# Patient Record
Sex: Female | Born: 1990 | Race: Black or African American | Hispanic: No | Marital: Single | Smoking: Current every day smoker
Health system: Southern US, Community
[De-identification: ages and names within clinical notes are randomized; demographics above are authoritative.]

## PROBLEM LIST (undated history)

## (undated) ENCOUNTER — Inpatient Hospital Stay (HOSPITAL_COMMUNITY): Payer: Self-pay

## (undated) DIAGNOSIS — J45909 Unspecified asthma, uncomplicated: Secondary | ICD-10-CM

## (undated) DIAGNOSIS — Z8759 Personal history of other complications of pregnancy, childbirth and the puerperium: Secondary | ICD-10-CM

## (undated) DIAGNOSIS — O139 Gestational [pregnancy-induced] hypertension without significant proteinuria, unspecified trimester: Secondary | ICD-10-CM

## (undated) DIAGNOSIS — Z8619 Personal history of other infectious and parasitic diseases: Secondary | ICD-10-CM

## (undated) DIAGNOSIS — A599 Trichomoniasis, unspecified: Secondary | ICD-10-CM

## (undated) HISTORY — PX: DILATION AND CURETTAGE OF UTERUS: SHX78

## (undated) HISTORY — DX: Personal history of other infectious and parasitic diseases: Z86.19

## (undated) HISTORY — PX: INDUCED ABORTION: SHX677

---
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
## 2008-05-30 ENCOUNTER — Ambulatory Visit: Payer: Self-pay | Admitting: Physician Assistant

## 2008-05-30 ENCOUNTER — Inpatient Hospital Stay (HOSPITAL_COMMUNITY): Admission: AD | Admit: 2008-05-30 | Discharge: 2008-05-31 | Payer: Self-pay | Admitting: Obstetrics

## 2008-08-14 ENCOUNTER — Ambulatory Visit (HOSPITAL_COMMUNITY): Admission: RE | Admit: 2008-08-14 | Discharge: 2008-08-14 | Payer: Self-pay | Admitting: Maternal and Fetal Medicine

## 2008-09-03 ENCOUNTER — Inpatient Hospital Stay (HOSPITAL_COMMUNITY): Admission: AD | Admit: 2008-09-03 | Discharge: 2008-09-03 | Payer: Self-pay | Admitting: Obstetrics

## 2008-09-04 ENCOUNTER — Ambulatory Visit (HOSPITAL_COMMUNITY): Admission: RE | Admit: 2008-09-04 | Discharge: 2008-09-04 | Payer: Self-pay | Admitting: Maternal and Fetal Medicine

## 2008-09-07 ENCOUNTER — Ambulatory Visit (HOSPITAL_COMMUNITY): Admission: RE | Admit: 2008-09-07 | Discharge: 2008-09-07 | Payer: Self-pay | Admitting: Obstetrics

## 2008-09-11 ENCOUNTER — Ambulatory Visit (HOSPITAL_COMMUNITY): Admission: RE | Admit: 2008-09-11 | Discharge: 2008-09-11 | Payer: Self-pay | Admitting: Obstetrics

## 2008-09-14 ENCOUNTER — Ambulatory Visit (HOSPITAL_COMMUNITY): Admission: RE | Admit: 2008-09-14 | Discharge: 2008-09-14 | Payer: Self-pay | Admitting: Obstetrics

## 2008-09-18 ENCOUNTER — Ambulatory Visit (HOSPITAL_COMMUNITY): Admission: RE | Admit: 2008-09-18 | Discharge: 2008-09-18 | Payer: Self-pay | Admitting: Obstetrics

## 2008-09-21 ENCOUNTER — Ambulatory Visit (HOSPITAL_COMMUNITY): Admission: RE | Admit: 2008-09-21 | Discharge: 2008-09-21 | Payer: Self-pay | Admitting: Obstetrics

## 2008-09-25 ENCOUNTER — Inpatient Hospital Stay (HOSPITAL_COMMUNITY): Admission: AD | Admit: 2008-09-25 | Discharge: 2008-09-28 | Payer: Self-pay | Admitting: Obstetrics

## 2008-09-25 ENCOUNTER — Encounter: Payer: Self-pay | Admitting: Obstetrics

## 2008-09-26 ENCOUNTER — Encounter (INDEPENDENT_AMBULATORY_CARE_PROVIDER_SITE_OTHER): Payer: Self-pay | Admitting: Obstetrics

## 2009-06-18 ENCOUNTER — Emergency Department (HOSPITAL_COMMUNITY): Admission: EM | Admit: 2009-06-18 | Discharge: 2009-06-18 | Payer: Self-pay | Admitting: Emergency Medicine

## 2009-11-03 ENCOUNTER — Emergency Department (HOSPITAL_COMMUNITY)
Admission: EM | Admit: 2009-11-03 | Discharge: 2009-11-03 | Payer: Self-pay | Source: Home / Self Care | Admitting: Emergency Medicine

## 2010-02-24 ENCOUNTER — Encounter: Payer: Self-pay | Admitting: Obstetrics

## 2010-02-24 ENCOUNTER — Encounter: Payer: Self-pay | Admitting: Maternal and Fetal Medicine

## 2010-04-17 LAB — URINALYSIS, ROUTINE W REFLEX MICROSCOPIC
Bilirubin Urine: NEGATIVE
Glucose, UA: NEGATIVE mg/dL
Ketones, ur: NEGATIVE mg/dL
Nitrite: NEGATIVE
Protein, ur: 30 mg/dL — AB
Specific Gravity, Urine: 1.009 (ref 1.005–1.030)
Urobilinogen, UA: 1 mg/dL (ref 0.0–1.0)
pH: 8 (ref 5.0–8.0)

## 2010-04-17 LAB — URINE MICROSCOPIC-ADD ON

## 2010-04-17 LAB — GC/CHLAMYDIA PROBE AMP, URINE
Chlamydia, Swab/Urine, PCR: NEGATIVE
GC Probe Amp, Urine: NEGATIVE

## 2010-04-17 LAB — PREGNANCY, URINE: Preg Test, Ur: NEGATIVE

## 2010-05-03 ENCOUNTER — Emergency Department (HOSPITAL_COMMUNITY): Payer: Medicaid Other

## 2010-05-03 ENCOUNTER — Emergency Department (HOSPITAL_COMMUNITY)
Admission: EM | Admit: 2010-05-03 | Discharge: 2010-05-03 | Disposition: A | Discharge status: Home / Self Care | Payer: Medicaid Other | Attending: Emergency Medicine | Admitting: Emergency Medicine

## 2010-05-03 ENCOUNTER — Inpatient Hospital Stay (HOSPITAL_COMMUNITY)
Admission: RE | Admit: 2010-05-03 | Discharge: 2010-05-03 | Disposition: A | Discharge status: Home / Self Care | Payer: Self-pay | Source: Ambulatory Visit | Attending: Emergency Medicine | Admitting: Emergency Medicine

## 2010-05-03 DIAGNOSIS — L0889 Other specified local infections of the skin and subcutaneous tissue: Secondary | ICD-10-CM | POA: Insufficient documentation

## 2010-05-03 DIAGNOSIS — IMO0002 Reserved for concepts with insufficient information to code with codable children: Secondary | ICD-10-CM | POA: Insufficient documentation

## 2010-05-03 DIAGNOSIS — W268XXA Contact with other sharp object(s), not elsewhere classified, initial encounter: Secondary | ICD-10-CM | POA: Insufficient documentation

## 2010-05-03 DIAGNOSIS — M79609 Pain in unspecified limb: Secondary | ICD-10-CM | POA: Insufficient documentation

## 2010-05-10 LAB — COMPREHENSIVE METABOLIC PANEL
ALT: 8 U/L (ref 0–35)
ALT: 9 U/L (ref 0–35)
AST: 21 U/L (ref 0–37)
Albumin: 1.8 g/dL — ABNORMAL LOW (ref 3.5–5.2)
Albumin: 2.6 g/dL — ABNORMAL LOW (ref 3.5–5.2)
Alkaline Phosphatase: 201 U/L — ABNORMAL HIGH (ref 39–117)
Alkaline Phosphatase: 347 U/L — ABNORMAL HIGH (ref 39–117)
CO2: 26 mEq/L (ref 19–32)
Calcium: 9 mg/dL (ref 8.4–10.5)
Chloride: 106 mEq/L (ref 96–112)
GFR calc Af Amer: >60 mL/min (ref >60)
GFR calc non Af Amer: >60 mL/min (ref >60)
Glucose, Bld: 60 mg/dL — ABNORMAL LOW (ref 70–99)
Potassium: 3.9 mEq/L (ref 3.5–5.1)
Potassium: 4 mEq/L (ref 3.5–5.1)
Sodium: 137 mEq/L (ref 135–145)
Total Bilirubin: 0.4 mg/dL (ref 0.3–1.2)
Total Protein: 6.4 g/dL (ref 6.0–8.3)

## 2010-05-10 LAB — CBC
Hemoglobin: 7.8 g/dL — CL (ref 12.0–15.0)
MCHC: 31.9 g/dL (ref 30.0–36.0)
MCHC: 32.6 g/dL (ref 30.0–36.0)
MCV: 82.1 fL (ref 78.0–100.0)
Platelets: 183 10*3/uL (ref 150–400)
Platelets: 186 10*3/uL (ref 150–400)
Platelets: 202 10*3/uL (ref 150–400)
RBC: 2.51 MIL/uL — ABNORMAL LOW (ref 3.87–5.11)
RBC: 2.9 MIL/uL — ABNORMAL LOW (ref 3.87–5.11)
RDW: 18 % — ABNORMAL HIGH (ref 11.5–15.5)
WBC: 12.1 10*3/uL — ABNORMAL HIGH (ref 4.0–10.5)
WBC: 14.9 10*3/uL — ABNORMAL HIGH (ref 4.0–10.5)

## 2010-05-10 LAB — TYPE AND SCREEN
ABO/RH(D): A POS
Antibody Screen: NEGATIVE

## 2010-05-10 LAB — RPR: RPR Ser Ql: NONREACTIVE

## 2010-05-10 LAB — LACTATE DEHYDROGENASE: LDH: 230 U/L (ref 94–250)

## 2010-06-17 NOTE — Op Note (Signed)
NAMEBRIANAH, HOPSON            ACCOUNT NO.:  [id number]   MEDICAL RECORD NO.:  [id number]          PATIENT TYPE:  AMB   LOCATION:  SDC                           FACILITY:  [facility]   PHYSICIAN:  Kathreen Cosier, M.D.DATE OF BIRTH:  1990-05-09   DATE OF PROCEDURE:  12/09/2006  DATE OF DISCHARGE:                               OPERATIVE REPORT   PREOPERATIVE DIAGNOSIS:  Intrauterine fetal demise at weeks.   POSTOPERATIVE DIAGNOSIS:  Intrauterine fetal demise at weeks.   PROCEDURE:  D&E.   PROCEDURE:  Using MAC, patient in the lithotomy position, perineum and  vagina prepped and draped, bladder emptied with a straight catheter.  Bimanual exam revealed uterus to be 8-weeks size.  Weighted speculum  placed in the vagina.  Cervix injected with 10 mL 1% Xylocaine.  The  cervix was open and easily admitted a #25 Pratt, #9 suction was used to  aspirate the uterine contents.  A large amount of tissue obtained and  this was done until the cavity was clean.  The patient tolerated the  procedure well and was taken to recovery room in good condition.           ______________________________  Kathreen Cosier, M.D.     BAM/MEDQ  D:  12/09/2006  T:  12/10/2006  Job:  [id number]
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
## 2010-10-23 LAB — URINALYSIS, ROUTINE W REFLEX MICROSCOPIC
Bilirubin Urine: NEGATIVE
Glucose, UA: NEGATIVE
Ketones, ur: NEGATIVE
Leukocytes, UA: NEGATIVE
pH: 7

## 2010-10-23 LAB — POCT PREGNANCY, URINE: Preg Test, Ur: POSITIVE

## 2010-10-23 LAB — URINE MICROSCOPIC-ADD ON

## 2010-10-23 LAB — INFLUENZA A+B VIRUS AG-DIRECT(RAPID)

## 2010-10-27 LAB — DIFFERENTIAL
Basophils Absolute: 0
Basophils Relative: 0
Eosinophils Absolute: 0
Eosinophils Relative: 1
Lymphocytes Relative: 53 — ABNORMAL HIGH
Lymphs Abs: 3.3
Monocytes Absolute: 0.6
Monocytes Relative: 9
Neutro Abs: 2.3
Neutrophils Relative %: 37 — ABNORMAL LOW

## 2010-10-27 LAB — I-STAT 8, (EC8 V) (CONVERTED LAB)
Acid-base deficit: 3 — ABNORMAL HIGH
BUN: 13
Chloride: 107
HCT: 41
Hemoglobin: 13.9
Operator id: 285841
Potassium: 3.9
Sodium: 139

## 2010-10-27 LAB — CBC
HCT: 35.3 — ABNORMAL LOW
Hemoglobin: 11.8 — ABNORMAL LOW
MCHC: 33.5
MCV: 91.7
Platelets: 310
RBC: 3.85 — ABNORMAL LOW
RDW: 13.5
WBC: 6.2

## 2010-10-27 LAB — POCT I-STAT CREATININE
Creatinine, Ser: 1.1
Operator id: 285841

## 2010-10-27 LAB — GC/CHLAMYDIA PROBE AMP, GENITAL: Chlamydia, DNA Probe: NEGATIVE

## 2010-10-27 LAB — WET PREP, GENITAL
Clue Cells Wet Prep HPF POC: NONE SEEN
Trich, Wet Prep: NONE SEEN
WBC, Wet Prep HPF POC: NONE SEEN
Yeast Wet Prep HPF POC: NONE SEEN

## 2010-10-27 LAB — POCT PREGNANCY, URINE: Preg Test, Ur: NEGATIVE

## 2010-10-29 LAB — POCT PREGNANCY, URINE
Operator id: 282201
Preg Test, Ur: NEGATIVE

## 2010-10-30 LAB — COMPREHENSIVE METABOLIC PANEL
ALT: 24
AST: 30
Albumin: 4
CO2: 25
Chloride: 99
GFR calc Af Amer: >60
GFR calc non Af Amer: >60
Potassium: 3.8
Sodium: 134 — ABNORMAL LOW
Total Bilirubin: 1.1

## 2010-10-30 LAB — DIFFERENTIAL
Basophils Absolute: 0
Eosinophils Absolute: 0
Eosinophils Relative: 0
Monocytes Absolute: 1.4 — ABNORMAL HIGH

## 2010-10-30 LAB — POCT PREGNANCY, URINE
Operator id: 151321
Preg Test, Ur: NEGATIVE

## 2010-10-30 LAB — CBC
RBC: 4
WBC: 11.4 — ABNORMAL HIGH

## 2010-10-30 LAB — URINALYSIS, ROUTINE W REFLEX MICROSCOPIC
Bilirubin Urine: NEGATIVE
Glucose, UA: NEGATIVE
Ketones, ur: NEGATIVE
Protein, ur: 30 — AB

## 2010-10-30 LAB — URINE MICROSCOPIC-ADD ON

## 2010-10-30 LAB — MONONUCLEOSIS SCREEN: Mono Screen: NEGATIVE

## 2010-11-11 LAB — HCG, QUANTITATIVE, PREGNANCY: hCG, Beta Chain, Quant, S: 7950 — ABNORMAL HIGH

## 2010-11-11 LAB — CBC
Hemoglobin: 12.1
MCHC: 34.7
MCV: 94.2
RDW: 13.3

## 2010-11-12 LAB — URINALYSIS, ROUTINE W REFLEX MICROSCOPIC
Bilirubin Urine: NEGATIVE
Glucose, UA: NEGATIVE
Ketones, ur: NEGATIVE
Leukocytes, UA: NEGATIVE
Protein, ur: NEGATIVE
pH: 7.5

## 2010-11-12 LAB — URINE MICROSCOPIC-ADD ON

## 2010-11-13 LAB — WET PREP, GENITAL
WBC, Wet Prep HPF POC: NONE SEEN
Yeast Wet Prep HPF POC: NONE SEEN
Yeast Wet Prep HPF POC: NONE SEEN

## 2010-11-13 LAB — URINALYSIS, ROUTINE W REFLEX MICROSCOPIC
Glucose, UA: NEGATIVE
Glucose, UA: NEGATIVE
Ketones, ur: NEGATIVE
Leukocytes, UA: NEGATIVE
Leukocytes, UA: NEGATIVE
Nitrite: POSITIVE — AB
Nitrite: POSITIVE — AB
Protein, ur: NEGATIVE
Specific Gravity, Urine: >1.03 — ABNORMAL HIGH
Urobilinogen, UA: 0.2
pH: 6

## 2010-11-13 LAB — DIFFERENTIAL
Basophils Absolute: 0
Basophils Relative: 0
Eosinophils Absolute: 0.1
Monocytes Relative: 13 — ABNORMAL HIGH
Neutrophils Relative %: 31 — ABNORMAL LOW

## 2010-11-13 LAB — GC/CHLAMYDIA PROBE AMP, GENITAL: Chlamydia, DNA Probe: POSITIVE — AB

## 2010-11-13 LAB — URINE CULTURE: Colony Count: >=100000

## 2010-11-13 LAB — URINE MICROSCOPIC-ADD ON

## 2010-11-13 LAB — CBC
HCT: 35.2 — ABNORMAL LOW
Hemoglobin: 11.9 — ABNORMAL LOW
MCHC: 33.9
MCHC: 34.7
MCV: 92.2
MCV: 93.5
Platelets: 217
RBC: 3.53 — ABNORMAL LOW
RDW: 13.1
RDW: 13.1

## 2010-11-13 LAB — HCG, QUANTITATIVE, PREGNANCY: hCG, Beta Chain, Quant, S: 2062 — ABNORMAL HIGH

## 2010-11-13 LAB — POCT PREGNANCY, URINE
Operator id: 27524
Preg Test, Ur: POSITIVE

## 2011-03-16 ENCOUNTER — Inpatient Hospital Stay (HOSPITAL_COMMUNITY): Payer: Medicaid Other

## 2011-03-16 ENCOUNTER — Inpatient Hospital Stay (HOSPITAL_COMMUNITY)
Admission: AD | Admit: 2011-03-16 | Discharge: 2011-03-16 | Disposition: A | Discharge status: Home / Self Care | Payer: Medicaid Other | Source: Ambulatory Visit | Attending: Obstetrics | Admitting: Obstetrics

## 2011-03-16 ENCOUNTER — Encounter (HOSPITAL_COMMUNITY): Payer: Self-pay | Admitting: *Deleted

## 2011-03-16 DIAGNOSIS — N390 Urinary tract infection, site not specified: Secondary | ICD-10-CM | POA: Insufficient documentation

## 2011-03-16 DIAGNOSIS — F17200 Nicotine dependence, unspecified, uncomplicated: Secondary | ICD-10-CM | POA: Insufficient documentation

## 2011-03-16 DIAGNOSIS — O200 Threatened abortion: Secondary | ICD-10-CM

## 2011-03-16 DIAGNOSIS — O99340 Other mental disorders complicating pregnancy, unspecified trimester: Secondary | ICD-10-CM | POA: Insufficient documentation

## 2011-03-16 DIAGNOSIS — R1031 Right lower quadrant pain: Secondary | ICD-10-CM | POA: Insufficient documentation

## 2011-03-16 DIAGNOSIS — F1010 Alcohol abuse, uncomplicated: Secondary | ICD-10-CM | POA: Insufficient documentation

## 2011-03-16 DIAGNOSIS — O98819 Other maternal infectious and parasitic diseases complicating pregnancy, unspecified trimester: Secondary | ICD-10-CM | POA: Insufficient documentation

## 2011-03-16 DIAGNOSIS — A599 Trichomoniasis, unspecified: Secondary | ICD-10-CM | POA: Insufficient documentation

## 2011-03-16 DIAGNOSIS — O2390 Unspecified genitourinary tract infection in pregnancy, unspecified trimester: Secondary | ICD-10-CM | POA: Insufficient documentation

## 2011-03-16 LAB — CBC
Platelets: 208 10*3/uL (ref 150–400)
RBC: 3.57 MIL/uL — ABNORMAL LOW (ref 3.87–5.11)
WBC: 6.7 10*3/uL (ref 4.0–10.5)

## 2011-03-16 LAB — URINALYSIS, ROUTINE W REFLEX MICROSCOPIC
Glucose, UA: NEGATIVE mg/dL
Ketones, ur: NEGATIVE mg/dL
Leukocytes, UA: NEGATIVE
Protein, ur: NEGATIVE mg/dL

## 2011-03-16 LAB — WET PREP, GENITAL: Yeast Wet Prep HPF POC: NONE SEEN

## 2011-03-16 LAB — RAPID URINE DRUG SCREEN, HOSP PERFORMED
Barbiturates: NOT DETECTED
Tetrahydrocannabinol: POSITIVE — AB

## 2011-03-16 LAB — URINE MICROSCOPIC-ADD ON

## 2011-03-16 MED ORDER — CEPHALEXIN 500 MG PO CAPS: 500.0000 mg | ORAL_CAPSULE | Freq: Three times a day (TID) | ORAL | Status: AC

## 2011-03-16 MED ORDER — METRONIDAZOLE 500 MG PO TABS: 500.0000 mg | ORAL_TABLET | Freq: Three times a day (TID) | ORAL | Status: AC

## 2011-03-16 NOTE — ED Provider Notes (Signed)
History     Chief Complaint  Patient presents with  . Abdominal Cramping   HPI This is a 21 y.o. female who presents with c/o pelvic pain and positive pregnancy test.  Has had pain "3-4 times in the past week or two".  Is concerned because she has been drinking and "doing other things" since then. Denies drug use, despite the fact that she is Very difficult to arouse.  OB History    Grav Para Term Preterm Abortions TAB SAB Ect Mult Living   4 1 1  2  2  1 2       Past Medical History  Diagnosis Date  . No pertinent past medical history     Past Surgical History  Procedure Date  . Dilation and curettage of uterus     No family history on file.  History  Substance Use Topics  . Smoking status: Current Everyday Smoker -- 0.5 packs/day    Types: Cigarettes  . Smokeless tobacco: Not on file  . Alcohol Use: Yes     drinks 2-3 glasses of vodka a day on the weekends    Allergies: Allergies no known allergies  Prescriptions prior to admission  Medication Sig Dispense Refill  . acetaminophen (TYLENOL) 325 MG tablet Take 650 mg by mouth as needed.      Marland Kitchen ibuprofen (ADVIL,MOTRIN) 200 MG tablet Take 200 mg by mouth as needed. Patient states that she takes advil on a regular babsis for headache        ROS As above  Physical Exam   Blood pressure 103/60, pulse 97, temperature 97.7 F (36.5 C), temperature source Oral, resp. rate 20, height 5\' 2"  (1.575 m), weight 111 lb (50.349 kg), last menstrual period 02/13/2011, SpO2 100.00%.  Physical Exam  Constitutional: She is oriented to person, place, and time. She appears well-developed and well-nourished. No distress.  HENT:  Head: Normocephalic.  Cardiovascular: Normal rate.   Respiratory: Effort normal.  GI: Soft. She exhibits no distension and no mass. There is no tenderness. There is no rebound and no guarding.  Genitourinary: Uterus normal. Vaginal discharge (thin white) found.       Uterus very small, nontender, adnexae  nontender, no masses appreciated  Musculoskeletal: Normal range of motion.  Neurological: She is alert and oriented to person, place, and time.  Skin: Skin is warm and dry.  Psychiatric: She has a normal mood and affect.   US Ob Comp Less 14 Wks  03/16/2011  *RADIOLOGY REPORT*  Clinical Data: Right lower quadrant pain for 2 weeks.  Quantitative beta HCG is 532.  Estimated gestational age by LMP is 4 weeks 3 days.  OBSTETRIC <14 WK [place] AND TRANSVAGINAL OB US  Technique:  Both transabdominal and transvaginal ultrasound examinations were performed for complete evaluation of the gestation as well as the maternal uterus, adnexal regions, and pelvic cul-de-sac.  Transvaginal technique was performed to assess early pregnancy.  Comparison:  None.  Intrauterine gestational sac:  A small cystic collection is demonstrated within the endometrium which likely represents a small gestational sac.  There is also fluid demonstrated in the endometrium distally. Yolk sac: Not visualized Embryo: Not visualized Cardiac Activity: Not demonstrated Heart Rate: N/A bpm  MSD: 1.7  mm  4    w 4      d             [place] EDC: 11/19/2011  Maternal uterus/adnexae: No focal myometrial masses demonstrated.  Small cervical cysts.  No free pelvic  fluid collections.  The right ovary measures 2.6 x 1.4 x 1.4 cm and is normal follicular changes.  The left ovary measures 1.4 x 3 x 1.9 cm and is normal follicular changes.  Too small para ovarian cyst are demonstrated measuring about 1 cm diameter each. Flow is demonstrated within the ovaries on color flow Doppler imaging.  IMPRESSION: Small cyst in the endometrium likely represents an early gestational sac.  Based on mean sac diameter, estimated gestational age is 4 weeks 4 days.  Follow-up is recommended with serial quantitative beta HCG and short-term ultrasound in 1-2 weeks. There is additional fluid demonstrated in the endometrium.  Ovaries are unremarkable.  Original Report Authenticated By:  Marlon Pel, M.D.    MAU Course  Procedures  Assessment and Plan  A: Pregnancy at 4.4wks by gest sac measurement     No fetus or yolk sac seen, cannot rule out ectopic yet     Trichomoniasis     UTI P:  D/C home      Followup quant in 48 hrs      Rx Metronidazole and Keflex     Urine to culture  [facility] 03/16/2011, 4:24 AM

## 2011-03-16 NOTE — ED Notes (Signed)
M. Williams CNM in to see pt 

## 2011-03-16 NOTE — Progress Notes (Signed)
Pt states she has been drinking and "doing other things" for about 2 weeks-has been feeling bad and had a +UPT at a med clinic on [address].-c/o of cramping on her rt side

## 2011-03-17 LAB — GC/CHLAMYDIA PROBE AMP, GENITAL: GC Probe Amp, Genital: NEGATIVE

## 2011-03-18 ENCOUNTER — Inpatient Hospital Stay (HOSPITAL_COMMUNITY)
Admission: AD | Admit: 2011-03-18 | Discharge: 2011-03-18 | Disposition: A | Discharge status: Home / Self Care | Payer: Medicaid Other | Source: Ambulatory Visit | Attending: Obstetrics | Admitting: Obstetrics

## 2011-03-18 ENCOUNTER — Encounter (HOSPITAL_COMMUNITY): Payer: Self-pay

## 2011-03-18 DIAGNOSIS — O99891 Other specified diseases and conditions complicating pregnancy: Secondary | ICD-10-CM | POA: Insufficient documentation

## 2011-03-18 DIAGNOSIS — Z09 Encounter for follow-up examination after completed treatment for conditions other than malignant neoplasm: Secondary | ICD-10-CM

## 2011-03-18 LAB — HCG, QUANTITATIVE, PREGNANCY: hCG, Beta Chain, Quant, S: 1498 m[IU]/mL — ABNORMAL HIGH (ref <5)

## 2011-03-18 NOTE — Progress Notes (Signed)
Pt here for repeat BHCG, denies any pain or bleeding.   

## 2011-03-18 NOTE — Discharge Instructions (Signed)
YOUR PREGNANCY HORMONE LEVEL TODAY HAS INCREASED FROM 532 TO 1498. THIS IS A NORMAL RISE. START YOUR PRENATAL CARE. RETURN HERE  AS NEEDED.    ________________________________________     To schedule your Maternity Eligibility Appointment, please call [phone].  When you arrive for your appointment you must bring the following items or information listed below.  Your appointment will be rescheduled if you do not have these items or are 15 minutes late. If currently receiving Medicaid, you MUST bring: 1. Medicaid Card 2. Social Security Card 3. Picture ID 4. Proof of Pregnancy 5. Verification of current address if the address on Medicaid card is incorrect "postmarked mail" If not receiving Medicaid, you MUST bring: 1. Social Security Card 2. Picture ID 3. Birth Certificate (if available) Passport or *Green Card 4. Proof of Pregnancy 5. Verification of current address "postmarked mail" for each income presented. 6. Verification of insurance coverage, if any 7. Check stubs from each employer for the previous month (if unable to present check stub  for each week, we will accept check stub for the first and last week ill the same month.) If you can't locate check stubs, you must bring a letter from the employer(s) and it must have the following information on letterhead, typed, in English: o name of company o company telephone number o how long been with the company, if less than one month o how much person earns per hour o how many hours per week work o the gross pay the person earned for the previous month If you are 23 years old or less, you do not have to bring proof of income unless you work or live with the father of the baby and at that time we will need proof of income from you and/or the father of the baby. Green Card recipients are eligible for Medicaid for Pregnant Women (MPW)

## 2011-03-18 NOTE — ED Provider Notes (Signed)
Attestation of Attending Supervision of Advanced Practitioner: Evaluation and management procedures were performed by the PA/NP/CNM/OB Fellow under my supervision/collaboration. Chart reviewed and agree with management and plan.  Islam Eichinger V 03/18/2011 3:47 PM

## 2011-03-18 NOTE — ED Provider Notes (Signed)
Julia Cain is a 21 y.o. female who presents to MAU for follow up Bhcg. She was evaluated 2 days ago and the Bhcg was 532 and ultrasound showed a ? 4.4 wd. IUGS, no YS or FP.  Discussed with Dr. Jolayne Panther and patient can start prenatal care. She does not need to return here for follow up ultrasound unless she has problems.  [place], [place] 03/18/11 1428

## 2011-10-22 ENCOUNTER — Encounter (HOSPITAL_COMMUNITY): Payer: Self-pay | Admitting: *Deleted

## 2011-10-22 ENCOUNTER — Emergency Department (HOSPITAL_COMMUNITY)
Admission: EM | Admit: 2011-10-22 | Discharge: 2011-10-23 | Disposition: A | Discharge status: Home / Self Care | Payer: Medicaid Other | Attending: Emergency Medicine | Admitting: Emergency Medicine

## 2011-10-22 DIAGNOSIS — F17200 Nicotine dependence, unspecified, uncomplicated: Secondary | ICD-10-CM | POA: Insufficient documentation

## 2011-10-22 DIAGNOSIS — A599 Trichomoniasis, unspecified: Secondary | ICD-10-CM

## 2011-10-22 DIAGNOSIS — A5901 Trichomonal vulvovaginitis: Secondary | ICD-10-CM | POA: Insufficient documentation

## 2011-10-22 DIAGNOSIS — Z91013 Allergy to seafood: Secondary | ICD-10-CM | POA: Insufficient documentation

## 2011-10-22 HISTORY — DX: Gestational (pregnancy-induced) hypertension without significant proteinuria, unspecified trimester: O13.9

## 2011-10-22 LAB — PREGNANCY, URINE: Preg Test, Ur: NEGATIVE

## 2011-10-22 NOTE — ED Notes (Signed)
Pt here for STD check. Pt reports vaginal discharge, strong odor to urine, and dysuria. Pt denies vaginal bleeding and pain.

## 2011-10-23 LAB — URINALYSIS, ROUTINE W REFLEX MICROSCOPIC
Bilirubin Urine: NEGATIVE
Ketones, ur: NEGATIVE mg/dL
Nitrite: NEGATIVE
Specific Gravity, Urine: 1.011 (ref 1.005–1.030)
pH: 7 (ref 5.0–8.0)

## 2011-10-23 LAB — URINE MICROSCOPIC-ADD ON

## 2011-10-23 MED ORDER — METRONIDAZOLE 500 MG PO TABS
2000.0000 mg | ORAL_TABLET | Freq: Once | ORAL | Status: AC
  Administered 2011-10-23: 2000 mg via ORAL
  Filled 2011-10-23: qty 4

## 2011-10-23 NOTE — ED Provider Notes (Signed)
History     CSN: [id number]  Arrival date & time 10/22/11  2249   First MD Initiated Contact with Patient 10/22/11 2332      Chief Complaint  Patient presents with  . SEXUALLY TRANSMITTED DISEASE    (Consider location/radiation/quality/duration/timing/severity/associated sxs/prior treatment) HPI  She presents to the emergency department with complaints of STD check. She says taht her boyfriends of many years got another women pregnant and she was recently diagnosed with Chlamydia. Therefore she needs to be checked for everything. She denies having any symptoms at all. She has just recently finished her period. VSS/NAD  Past Medical History  Diagnosis Date  . No pertinent past medical history   . PIH (pregnancy induced hypertension)     Past Surgical History  Procedure Date  . Dilation and curettage of uterus     No family history on file.  History  Substance Use Topics  . Smoking status: Current Every Day Smoker -- 0.5 packs/day    Types: Cigarettes  . Smokeless tobacco: Never Used  . Alcohol Use: Yes     drinks 2-3 glasses of vodka a day on the weekends    OB History    Grav Para Term Preterm Abortions TAB SAB Ect Mult Living   4 1 1  2  2  1 2       Review of Systems  Review of Systems  Gen: no weight loss, fevers, chills, night sweats  Eyes: no discharge or drainage, no occular pain or visual changes  Nose: no epistaxis or rhinorrhea  Mouth: no dental pain, no sore throat  Neck: no neck pain  Lungs:No wheezing, coughing or hemoptysis CV: no chest pain, palpitations, dependent edema or orthopnea  Abd: no abdominal pain, nausea, vomiting  GU: no dysuria or gross hematuria  MSK:  No abnormalities  Neuro: no headache, no focal neurologic deficits  Skin: no abnormalities Psyche: negative.   Allergies  Shellfish allergy  Home Medications   Current Outpatient Rx  Name Route Sig Dispense Refill  . ACETAMINOPHEN 325 MG PO TABS Oral Take 650 mg by mouth  as needed. For pain    . PRESCRIPTION MEDICATION Oral Take 1 tablet by mouth daily. Birth control pill      BP 130/94  Pulse 63  Temp 98 F (36.7 C) (Oral)  Resp 18  SpO2 100%  LMP 02/09/2011  Breastfeeding? Unknown  Physical Exam  Nursing note and vitals reviewed. Constitutional: She appears well-developed and well-nourished. No distress.  HENT:  Head: Normocephalic and atraumatic.  Eyes: Pupils are equal, round, and reactive to light.  Neck: Normal range of motion. Neck supple.  Cardiovascular: Normal rate and regular rhythm.   Pulmonary/Chest: Effort normal.  Abdominal: Soft.  Genitourinary: Vagina normal and uterus normal. Cervix exhibits no motion tenderness and no discharge. Right adnexum displays no tenderness. Left adnexum displays no tenderness.  Neurological: She is alert.  Skin: Skin is warm and dry.    ED Course  Procedures (including critical care time)  Labs Reviewed  URINALYSIS, ROUTINE W REFLEX MICROSCOPIC - Abnormal; Notable for the following:    Hgb urine dipstick MODERATE (*)     Leukocytes, UA SMALL (*)     All other components within normal limits  URINE MICROSCOPIC-ADD ON - Abnormal; Notable for the following:    Squamous Epithelial / LPF FEW (*)     Bacteria, UA FEW (*)     All other components within normal limits  PREGNANCY, URINE  RAPID HIV SCREEN (  [facility])  GC/CHLAMYDIA PROBE AMP, GENITAL  WET PREP, GENITAL   No results found.   1. Trichimoniasis       MDM  Nurse forgot to label wet prep tube. Pt is asymptomatic therefore I will not redo my pelvic exam. GC cultures sent out, HIV test negative. Urinalysis shows Trich. Pt given Flagyl 2mg  PO in ED.  Advised that she will be called if symptoms come back positive.  Pt has been advised of the symptoms that warrant their return to the ED. Patient has voiced understanding and has agreed to follow-up with the PCP or specialist.       Dorthula Matas, PA 10/23/11 [phone]

## 2011-10-23 NOTE — ED Notes (Addendum)
Informed PA about HIV results. Lab informed me that wet prep was not labeled. PA informed and pt informed.  Pt informed that lab was not labeled and therefore not collected. Pt woken from sleep but verbalized understanding.

## 2011-10-23 NOTE — ED Provider Notes (Signed)
Medical screening examination/treatment/procedure(s) were performed by non-physician practitioner and as supervising physician I was immediately available for consultation/collaboration.  Olivia Mackie, MD 10/23/11 [phone]

## 2012-03-16 ENCOUNTER — Emergency Department (HOSPITAL_COMMUNITY)
Admission: EM | Admit: 2012-03-16 | Discharge: 2012-03-16 | Disposition: A | Discharge status: Home / Self Care | Payer: Self-pay | Attending: Emergency Medicine | Admitting: Emergency Medicine

## 2012-03-16 ENCOUNTER — Encounter (HOSPITAL_COMMUNITY): Payer: Self-pay

## 2012-03-16 ENCOUNTER — Emergency Department (HOSPITAL_COMMUNITY): Payer: Self-pay

## 2012-03-16 DIAGNOSIS — R109 Unspecified abdominal pain: Secondary | ICD-10-CM | POA: Insufficient documentation

## 2012-03-16 DIAGNOSIS — Z3490 Encounter for supervision of normal pregnancy, unspecified, unspecified trimester: Secondary | ICD-10-CM

## 2012-03-16 DIAGNOSIS — R110 Nausea: Secondary | ICD-10-CM | POA: Insufficient documentation

## 2012-03-16 DIAGNOSIS — Z9889 Other specified postprocedural states: Secondary | ICD-10-CM | POA: Insufficient documentation

## 2012-03-16 DIAGNOSIS — O9989 Other specified diseases and conditions complicating pregnancy, childbirth and the puerperium: Secondary | ICD-10-CM | POA: Insufficient documentation

## 2012-03-16 DIAGNOSIS — Z8679 Personal history of other diseases of the circulatory system: Secondary | ICD-10-CM | POA: Insufficient documentation

## 2012-03-16 DIAGNOSIS — O4690 Antepartum hemorrhage, unspecified, unspecified trimester: Secondary | ICD-10-CM

## 2012-03-16 DIAGNOSIS — O99330 Smoking (tobacco) complicating pregnancy, unspecified trimester: Secondary | ICD-10-CM | POA: Insufficient documentation

## 2012-03-16 DIAGNOSIS — O209 Hemorrhage in early pregnancy, unspecified: Secondary | ICD-10-CM | POA: Insufficient documentation

## 2012-03-16 LAB — URINALYSIS, ROUTINE W REFLEX MICROSCOPIC
Glucose, UA: NEGATIVE mg/dL
Leukocytes, UA: NEGATIVE
Specific Gravity, Urine: 1.026 (ref 1.005–1.030)
pH: 7.5 (ref 5.0–8.0)

## 2012-03-16 LAB — CBC WITH DIFFERENTIAL/PLATELET
Basophils Relative: 0 % (ref 0–1)
Eosinophils Absolute: 0.2 10*3/uL (ref 0.0–0.7)
HCT: 35.7 % — ABNORMAL LOW (ref 36.0–46.0)
Hemoglobin: 11.7 g/dL — ABNORMAL LOW (ref 12.0–15.0)
Lymphs Abs: 2.1 10*3/uL (ref 0.7–4.0)
MCH: 31.5 pg (ref 26.0–34.0)
MCHC: 32.8 g/dL (ref 30.0–36.0)
Monocytes Absolute: 0.7 10*3/uL (ref 0.1–1.0)
Monocytes Relative: 10 % (ref 3–12)

## 2012-03-16 LAB — URINE MICROSCOPIC-ADD ON

## 2012-03-16 LAB — POCT I-STAT, CHEM 8
BUN: 10 mg/dL (ref 6–23)
Creatinine, Ser: 0.8 mg/dL (ref 0.50–1.10)
Glucose, Bld: 87 mg/dL (ref 70–99)
Hemoglobin: 12.2 g/dL (ref 12.0–15.0)
Potassium: 4.1 mEq/L (ref 3.5–5.1)

## 2012-03-16 LAB — ABO/RH: ABO/RH(D): A POS

## 2012-03-16 LAB — PREGNANCY, URINE: Preg Test, Ur: POSITIVE — AB

## 2012-03-16 NOTE — ED Notes (Signed)
Examine completed (pelvic)

## 2012-03-16 NOTE — ED Notes (Signed)
Patient transported to Ultrasound 

## 2012-03-16 NOTE — ED Provider Notes (Signed)
History     CSN: [id number]  Arrival date & time 03/16/12  1237   First MD Initiated Contact with Patient 03/16/12 1326      Chief Complaint  Patient presents with  . Vaginal Bleeding    (Consider location/radiation/quality/duration/timing/severity/associated sxs/prior treatment) HPI Comments: G5P2 presents today for vaginal bleeding x 2 days.  States she had rough intercourse yesterday morning and has been having episodes of vaginal bleeding, without clots, ever since.  Is having abdominal cramping, mostly on her right side, with some nausea.  Took multiple home pregnancy tests which were positive.  Pregnancy not yet confirmed by u/s. Has had 3 prior miscarriages (1 spontaneous, 2 elected).  No hx of ectopic pregnancies.  Denies any chest pain, vomiting, or fever.  Patient is a 22 y.o. female presenting with vaginal bleeding. The history is provided by the patient.  Vaginal Bleeding Associated symptoms include abdominal pain and nausea.    Past Medical History  Diagnosis Date  . No pertinent past medical history   . PIH (pregnancy induced hypertension)     Past Surgical History  Procedure Laterality Date  . Dilation and curettage of uterus      No family history on file.  History  Substance Use Topics  . Smoking status: Current Every Day Smoker -- 0.50 packs/day    Types: Cigarettes  . Smokeless tobacco: Never Used  . Alcohol Use: Yes     Comment: drinks 2-3 glasses of vodka a day on the weekends    OB History   Grav Para Term Preterm Abortions TAB SAB Ect Mult Living   4 1 1  2  2  1 2       Review of Systems  Gastrointestinal: Positive for nausea and abdominal pain.  Genitourinary: Positive for vaginal bleeding.  All other systems reviewed and are negative.    Allergies  Shellfish allergy  Home Medications  No current outpatient prescriptions on file.  BP 108/62  Pulse 80  Temp(Src) 98.7 F (37.1 C) (Oral)  Resp 16  SpO2 100%  LMP  12/28/2011  Physical Exam  Nursing note and vitals reviewed. Constitutional: She is oriented to person, place, and time. She appears well-developed and well-nourished.  HENT:  Head: Normocephalic and atraumatic.  Mouth/Throat: Oropharynx is clear and moist.  Eyes: Conjunctivae and EOM are normal. Pupils are equal, round, and reactive to light.  Neck: Normal range of motion.  Cardiovascular: Normal rate, regular rhythm and normal heart sounds.   Pulmonary/Chest: Effort normal and breath sounds normal.  Abdominal: Soft. Bowel sounds are normal. She exhibits distension. There is tenderness (right flank). There is no CVA tenderness, no tenderness at McBurney's point and negative Murphy's sign.  Genitourinary: There is no lesion on the right labia. There is no lesion on the left labia. Cervix exhibits no motion tenderness. There is bleeding (spotting) around the vagina. No erythema or tenderness around the vagina. Vaginal discharge (scant) found.  Cervical os closed.  Neurological: She is alert and oriented to person, place, and time.  Skin: Skin is warm and dry.  Psychiatric: She has a normal mood and affect.    ED Course  Procedures (including critical care time)  Labs Reviewed  WET PREP, GENITAL - Abnormal; Notable for the following:    WBC, Wet Prep HPF POC FEW (*)    All other components within normal limits  URINALYSIS, ROUTINE W REFLEX MICROSCOPIC - Abnormal; Notable for the following:    APPearance CLOUDY (*)    Hgb  urine dipstick MODERATE (*)    Protein, ur 30 (*)    All other components within normal limits  PREGNANCY, URINE - Abnormal; Notable for the following:    Preg Test, Ur POSITIVE (*)    All other components within normal limits  CBC WITH DIFFERENTIAL - Abnormal; Notable for the following:    RBC 3.71 (*)    Hemoglobin 11.7 (*)    HCT 35.7 (*)    All other components within normal limits  HCG, QUANTITATIVE, PREGNANCY - Abnormal; Notable for the following:    hCG,  Beta Chain, Quant, S [id number] (*)    All other components within normal limits  URINE MICROSCOPIC-ADD ON - Abnormal; Notable for the following:    Casts GRANULAR CAST (*)    All other components within normal limits  GC/CHLAMYDIA PROBE AMP  POCT I-STAT, CHEM 8  ABO/RH   US Ob Comp Less 14 Wks  03/16/2012  *RADIOLOGY REPORT*  Clinical Data: Vaginal bleeding.  OBSTETRIC <14 WK [place] AND TRANSVAGINAL OB US  Technique:  Both transabdominal and transvaginal ultrasound examinations were performed for complete evaluation of the gestation as well as the maternal uterus, adnexal regions, and pelvic cul-de-sac.  Transvaginal technique was performed to assess early pregnancy.  Comparison:  None for this pregnancy.  Intrauterine gestational sac:  Single.  Normal in shape. Yolk sac: Present Embryo: Present Cardiac Activity: Present Heart Rate: 141  bpm  CRL: 11  mm  7 w  2 d         [place] EDC: 10/31/2012  Maternal uterus/adnexae: The uterus is otherwise within normal limits.  A tiny subchorionic hemorrhage is present.  Minimal free fluid is present.  The adnexa are unremarkable.  IMPRESSION:  1.  Single intrauterine pregnancy with normal heart rate. 2.  Minimal free fluid and tiny subchorionic hemorrhage, unlikely to be of consequence.   Original Report Authenticated By: Marin Roberts, M.D.    US Ob Transvaginal  03/16/2012  *RADIOLOGY REPORT*  Clinical Data: Vaginal bleeding.  OBSTETRIC <14 WK [place] AND TRANSVAGINAL OB US  Technique:  Both transabdominal and transvaginal ultrasound examinations were performed for complete evaluation of the gestation as well as the maternal uterus, adnexal regions, and pelvic cul-de-sac.  Transvaginal technique was performed to assess early pregnancy.  Comparison:  None for this pregnancy.  Intrauterine gestational sac:  Single.  Normal in shape. Yolk sac: Present Embryo: Present Cardiac Activity: Present Heart Rate: 141  bpm  CRL: 11  mm  7 w  2 d         [place] EDC: 10/31/2012  Maternal  uterus/adnexae: The uterus is otherwise within normal limits.  A tiny subchorionic hemorrhage is present.  Minimal free fluid is present.  The adnexa are unremarkable.  IMPRESSION:  1.  Single intrauterine pregnancy with normal heart rate. 2.  Minimal free fluid and tiny subchorionic hemorrhage, unlikely to be of consequence.   Original Report Authenticated By: Marin Roberts, M.D.      1. Pregnancy   2. Vaginal bleeding in pregnancy       MDM   1:45 PM Pt evaluated.  Multiple home pregnancy tests +, not yet confirmed by u/s.  Vaginal bleeding following rough intercourse.  Will do pelvic.  Labs pending.  2:13 PM Pelvic complete.  Cervical os closed and without evidence of inflammation.  Scant vaginal d/c.  Cultures pending.  3:57 PM Confirmed single IUP with stable fetal HR.  Pt states she has a regular OBGYN, instructed to follow  up as soon as possible. Included resource list for other services if needed.  Return to the ED for new or worsening symptoms.     Garlon Hatchet, PA-C 03/16/12 1615  Garlon Hatchet, PA-C 03/16/12 2010

## 2012-03-16 NOTE — ED Notes (Signed)
Pt states she is [redacted]wks pregnant, had rough sexual intercourse yesterday morning and has been bleeding since. States now having abdominal cramping with nausea, denies vomiting

## 2012-03-18 LAB — GC/CHLAMYDIA PROBE AMP
CT Probe RNA: NEGATIVE
GC Probe RNA: NEGATIVE

## 2012-03-18 NOTE — ED Provider Notes (Signed)
Medical screening examination/treatment/procedure(s) were performed by non-physician practitioner and as supervising physician I was immediately available for consultation/collaboration.   Nelia Shi, MD 03/18/12 [phone]

## 2012-03-26 ENCOUNTER — Inpatient Hospital Stay (HOSPITAL_COMMUNITY)
Admission: AD | Admit: 2012-03-26 | Discharge: 2012-03-26 | Disposition: A | Discharge status: Home / Self Care | Payer: Self-pay | Source: Ambulatory Visit | Attending: Obstetrics | Admitting: Obstetrics

## 2012-03-26 ENCOUNTER — Encounter (HOSPITAL_COMMUNITY): Payer: Self-pay | Admitting: Obstetrics and Gynecology

## 2012-03-26 DIAGNOSIS — O99891 Other specified diseases and conditions complicating pregnancy: Secondary | ICD-10-CM | POA: Insufficient documentation

## 2012-03-26 DIAGNOSIS — R1031 Right lower quadrant pain: Secondary | ICD-10-CM

## 2012-03-26 LAB — URINE MICROSCOPIC-ADD ON

## 2012-03-26 LAB — CBC
HCT: 34.6 % — ABNORMAL LOW (ref 36.0–46.0)
Hemoglobin: 11.6 g/dL — ABNORMAL LOW (ref 12.0–15.0)
RBC: 3.63 MIL/uL — ABNORMAL LOW (ref 3.87–5.11)
WBC: 7.8 10*3/uL (ref 4.0–10.5)

## 2012-03-26 LAB — URINALYSIS, ROUTINE W REFLEX MICROSCOPIC
Bilirubin Urine: NEGATIVE
Ketones, ur: NEGATIVE mg/dL
Nitrite: NEGATIVE
Urobilinogen, UA: 0.2 mg/dL (ref 0.0–1.0)
pH: 6 (ref 5.0–8.0)

## 2012-03-26 LAB — RAPID URINE DRUG SCREEN, HOSP PERFORMED
Barbiturates: NOT DETECTED
Benzodiazepines: NOT DETECTED

## 2012-03-26 NOTE — MAU Provider Note (Signed)
History     CSN: [id number]  Arrival date and time: 03/26/12 1519   None     Chief Complaint  Patient presents with  . Abdominal Pain   HPI  Julia Cain is a 22 y.o. [phone] at about 8 weeks who presents today with RLQ pain since about 0100 today. She denies fever, nausea or vomiting. She is concerned because she had been using drugs when she found out she was pregnant and she is worried about the baby. She had an ultrasound done last week that showed a viable IUP.    Past Medical History  Diagnosis Date  . No pertinent past medical history   . PIH (pregnancy induced hypertension)     Past Surgical History  Procedure Laterality Date  . Dilation and curettage of uterus      No family history on file.  History  Substance Use Topics  . Smoking status: Current Every Day Smoker -- 0.50 packs/day    Types: Cigarettes  . Smokeless tobacco: Never Used  . Alcohol Use: Yes     Comment: drinks 2-3 glasses of vodka a day on the weekends    Allergies:  Allergies  Allergen Reactions  . Shellfish Allergy Itching, Swelling and Rash    Prescriptions prior to admission  Medication Sig Dispense Refill  . albuterol (PROVENTIL HFA;VENTOLIN HFA) 108 (90 BASE) MCG/ACT inhaler Inhale 2 puffs into the lungs every 6 (six) hours as needed (Asthma).        Review of Systems  Constitutional: Negative for fever and chills.  Gastrointestinal: Positive for abdominal pain. Negative for nausea, vomiting, diarrhea and constipation.  Genitourinary: Negative for dysuria, urgency and frequency.  Musculoskeletal: Negative for myalgias.  Neurological: Negative for dizziness and headaches.   Physical Exam   Blood pressure 104/67, pulse 98, temperature 98 F (36.7 C), temperature source Oral, resp. rate 18, weight 51.71 kg (114 lb), last menstrual period 12/28/2011.  Physical Exam  Nursing note and vitals reviewed. Constitutional: She is oriented to person, place, and time. She appears  well-developed and well-nourished. No distress.  Cardiovascular: Normal rate.   Respiratory: Effort normal.  GI: Soft. She exhibits no distension. There is no tenderness. There is no rebound and no guarding.  Neurological: She is alert and oriented to person, place, and time.  Skin: Skin is warm and dry.  Psychiatric: She has a normal mood and affect.    MAU Course  Procedures  Results for orders placed during the hospital encounter of 03/26/12 (from the past 24 hour(s))  URINALYSIS, ROUTINE W REFLEX MICROSCOPIC     Status: Abnormal   Collection Time    03/26/12  3:47 PM      Result Value Range   Color, Urine YELLOW  YELLOW   APPearance HAZY (*) CLEAR   Specific Gravity, Urine 1.025  1.005 - 1.030   pH 6.0  5.0 - 8.0   Glucose, UA NEGATIVE  NEGATIVE mg/dL   Hgb urine dipstick LARGE (*) NEGATIVE   Bilirubin Urine NEGATIVE  NEGATIVE   Ketones, ur NEGATIVE  NEGATIVE mg/dL   Protein, ur NEGATIVE  NEGATIVE mg/dL   Urobilinogen, UA 0.2  0.0 - 1.0 mg/dL   Nitrite NEGATIVE  NEGATIVE   Leukocytes, UA TRACE (*) NEGATIVE  URINE MICROSCOPIC-ADD ON     Status: Abnormal   Collection Time    03/26/12  3:47 PM      Result Value Range   Squamous Epithelial / LPF FEW (*) RARE   WBC,  UA 3-6  <3 WBC/hpf   RBC / HPF 21-50  <3 RBC/hpf   Bacteria, UA FEW (*) RARE   Urine-Other MUCOUS PRESENT    CBC     Status: Abnormal   Collection Time    03/26/12  4:54 PM      Result Value Range   WBC 7.8  4.0 - 10.5 K/uL   RBC 3.63 (*) 3.87 - 5.11 MIL/uL   Hemoglobin 11.6 (*) 12.0 - 15.0 g/dL   HCT 91.4 (*) 78.2 - 95.6 %   MCV 95.3  78.0 - 100.0 fL   MCH 32.0  26.0 - 34.0 pg   MCHC 33.5  30.0 - 36.0 g/dL   RDW 21.3  08.6 - 57.8 %   Platelets 242  150 - 400 K/uL   Assessment and Plan   1. RLQ abdominal pain    Start [facility] as soon as possible First trimester danger signs reviewed  Tawnya Crook 03/26/2012, 4:53 PM

## 2012-03-26 NOTE — MAU Note (Signed)
Julia Cain is here today with bilateral rib pain. She was seen at [facility] about 10 days ago and found out she was pregnant. 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 She says she is still hanging around people that use, however she does not want to use while pregnant. She is scared that she may have hurt her baby.

## 2012-03-28 LAB — URINE CULTURE

## 2012-04-28 ENCOUNTER — Encounter: Payer: Self-pay | Admitting: *Deleted

## 2012-04-28 ENCOUNTER — Encounter: Payer: Medicaid Other | Admitting: Obstetrics & Gynecology

## 2012-05-06 ENCOUNTER — Inpatient Hospital Stay (HOSPITAL_COMMUNITY): Payer: Self-pay

## 2012-05-06 ENCOUNTER — Inpatient Hospital Stay (HOSPITAL_COMMUNITY)
Admission: AD | Admit: 2012-05-06 | Discharge: 2012-05-07 | Disposition: A | Discharge status: Home / Self Care | Payer: MEDICAID | Source: Ambulatory Visit | Attending: Obstetrics and Gynecology | Admitting: Obstetrics and Gynecology

## 2012-05-06 ENCOUNTER — Encounter (HOSPITAL_COMMUNITY): Payer: Self-pay | Admitting: *Deleted

## 2012-05-06 DIAGNOSIS — O2342 Unspecified infection of urinary tract in pregnancy, second trimester: Secondary | ICD-10-CM

## 2012-05-06 DIAGNOSIS — R109 Unspecified abdominal pain: Secondary | ICD-10-CM | POA: Insufficient documentation

## 2012-05-06 DIAGNOSIS — N390 Urinary tract infection, site not specified: Secondary | ICD-10-CM | POA: Insufficient documentation

## 2012-05-06 DIAGNOSIS — N949 Unspecified condition associated with female genital organs and menstrual cycle: Secondary | ICD-10-CM | POA: Insufficient documentation

## 2012-05-06 DIAGNOSIS — O2390 Unspecified genitourinary tract infection in pregnancy, unspecified trimester: Secondary | ICD-10-CM | POA: Insufficient documentation

## 2012-05-06 HISTORY — DX: Gestational (pregnancy-induced) hypertension without significant proteinuria, unspecified trimester: O13.9

## 2012-05-06 HISTORY — DX: Unspecified asthma, uncomplicated: J45.909

## 2012-05-06 LAB — URINALYSIS, ROUTINE W REFLEX MICROSCOPIC
Glucose, UA: NEGATIVE mg/dL
Leukocytes, UA: NEGATIVE
Protein, ur: NEGATIVE mg/dL
Specific Gravity, Urine: 1.015 (ref 1.005–1.030)
pH: 7 (ref 5.0–8.0)

## 2012-05-06 LAB — WET PREP, GENITAL
Trich, Wet Prep: NONE SEEN
Yeast Wet Prep HPF POC: NONE SEEN

## 2012-05-06 LAB — URINE MICROSCOPIC-ADD ON

## 2012-05-06 MED ORDER — NITROFURANTOIN MONOHYD MACRO 100 MG PO CAPS
100.0000 mg | ORAL_CAPSULE | Freq: Once | ORAL | Status: AC
  Administered 2012-05-07: 100 mg via ORAL
  Filled 2012-05-06: qty 1

## 2012-05-06 NOTE — MAU Provider Note (Signed)
History     CSN: [id number]  Arrival date and time: 05/06/12 2205   First Provider Initiated Contact with Patient 05/06/12 2309      Chief Complaint  Patient presents with  . Pelvic Pain   HPI  Julia Cain is a 22 y.o. [phone] at [redacted]w[redacted]d who presents today because she fell Wednesday evening. She states that she landed on her hip, side of her abdomen and her hand. She has since developed a vaginal discharge with some red blood.   Past Medical History  Diagnosis Date  . No pertinent past medical history   . PIH (pregnancy induced hypertension)   . Asthma   . Pregnancy induced hypertension     Past Surgical History  Procedure Laterality Date  . Dilation and curettage of uterus    . Induced abortion      Family History  Problem Relation Age of Onset  . Hypertension Mother   . Cancer Paternal Uncle     History  Substance Use Topics  . Smoking status: Current Every Day Smoker -- 0.50 packs/day    Types: Cigarettes  . Smokeless tobacco: Never Used  . Alcohol Use: Yes     Comment: drinks 2-3 glasses of vodka a day on the weekends. States no alcohol since last time here    Allergies:  Allergies  Allergen Reactions  . Shellfish Allergy Itching, Swelling and Rash    Prescriptions prior to admission  Medication Sig Dispense Refill  . albuterol (PROVENTIL HFA;VENTOLIN HFA) 108 (90 BASE) MCG/ACT inhaler Inhale 2 puffs into the lungs every 6 (six) hours as needed (Asthma).        Review of Systems  Constitutional: Negative for fever and chills.  Eyes: Negative for blurred vision.  Gastrointestinal: Positive for abdominal pain. Negative for nausea, vomiting, diarrhea and constipation.  Genitourinary: Negative for dysuria, urgency and frequency.  Neurological: Negative for dizziness and headaches.   Physical Exam   Blood pressure 103/74, pulse 96, temperature 98.2 F (36.8 C), resp. rate 20, height 5\' 2"  (1.575 m), weight 54.069 kg (119 lb 3.2 oz), last  menstrual period 12/28/2011.  Physical Exam  Nursing note and vitals reviewed. Constitutional: She is oriented to person, place, and time. She appears well-developed and well-nourished. No distress.  Cardiovascular: Normal rate.   Respiratory: Effort normal.  GI: Soft. She exhibits no distension. Tenderness: Right sided tenderness.  Genitourinary:   External: no lesion Vagina: small amount of white discharge, no blood seen Cervix: closed Uterus: LGA  Neurological: She is alert and oriented to person, place, and time.  Skin: Skin is warm and dry.  Psychiatric: She has a normal mood and affect.    MAU Course  Procedures  Results for orders placed during the hospital encounter of 05/06/12 (from the past 24 hour(s))  URINALYSIS, ROUTINE W REFLEX MICROSCOPIC     Status: Abnormal   Collection Time    05/06/12 10:30 PM      Result Value Range   Color, Urine YELLOW  YELLOW   APPearance CLEAR  CLEAR   Specific Gravity, Urine 1.015  1.005 - 1.030   pH 7.0  5.0 - 8.0   Glucose, UA NEGATIVE  NEGATIVE mg/dL   Hgb urine dipstick LARGE (*) NEGATIVE   Bilirubin Urine NEGATIVE  NEGATIVE   Ketones, ur NEGATIVE  NEGATIVE mg/dL   Protein, ur NEGATIVE  NEGATIVE mg/dL   Urobilinogen, UA 1.0  0.0 - 1.0 mg/dL   Nitrite POSITIVE (*) NEGATIVE   Leukocytes, UA  NEGATIVE  NEGATIVE  URINE MICROSCOPIC-ADD ON     Status: Abnormal   Collection Time    05/06/12 10:30 PM      Result Value Range   Squamous Epithelial / LPF FEW (*) RARE   WBC, UA 3-6  <3 WBC/hpf   RBC / HPF 11-20  <3 RBC/hpf   Bacteria, UA MANY (*) RARE  WET PREP, GENITAL     Status: Abnormal   Collection Time    05/06/12 11:15 PM      Result Value Range   Yeast Wet Prep HPF POC NONE SEEN  NONE SEEN   Trich, Wet Prep NONE SEEN  NONE SEEN   Clue Cells Wet Prep HPF POC MODERATE (*) NONE SEEN   WBC, Wet Prep HPF POC FEW (*) NONE SEEN   USE: shows 1 fetus with cardiac activity   Assessment and Plan   1. UTI in pregnancy,  antepartum, second trimester    RX: macrobid 100mg  po bid X 7 Start [facility] as soon as possible.   Tawnya Crook 05/06/2012, 11:11 PM

## 2012-05-06 NOTE — MAU Note (Signed)
Wednesday night I fell and caught myself with my hands but L side of my stomach. My pelvic area hurts on R and L  Sides of stomach. I had light pink d/c about 0200 when I wiped this am.

## 2012-05-07 DIAGNOSIS — O2390 Unspecified genitourinary tract infection in pregnancy, unspecified trimester: Secondary | ICD-10-CM

## 2012-05-07 LAB — GC/CHLAMYDIA PROBE AMP: CT Probe RNA: NEGATIVE

## 2012-05-07 MED ORDER — NITROFURANTOIN MONOHYD MACRO 100 MG PO CAPS: 100.0000 mg | ORAL_CAPSULE | Freq: Two times a day (BID) | ORAL | Status: DC

## 2012-05-07 NOTE — Progress Notes (Signed)
Zorita Pang CNM in to discuss u/s and lab results with d/c plan. Written and verbal d/c instructions given and understanding voiced

## 2012-05-07 NOTE — MAU Provider Note (Signed)
Attestation of Attending Supervision of Advanced Practitioner (CNM/NP): Evaluation and management procedures were performed by the Advanced Practitioner under my supervision and collaboration.  I have reviewed the Advanced Practitioner's note and chart, and I agree with the management and plan.  Trina Asch 05/07/2012 10:50 PM

## 2012-05-09 LAB — URINE CULTURE

## 2012-05-18 ENCOUNTER — Inpatient Hospital Stay (HOSPITAL_COMMUNITY)
Admission: AD | Admit: 2012-05-18 | Discharge: 2012-05-18 | Disposition: A | Discharge status: Home / Self Care | Payer: MEDICAID | Source: Ambulatory Visit | Attending: Obstetrics and Gynecology | Admitting: Obstetrics and Gynecology

## 2012-05-18 ENCOUNTER — Encounter (HOSPITAL_COMMUNITY): Payer: Self-pay

## 2012-05-18 DIAGNOSIS — A499 Bacterial infection, unspecified: Secondary | ICD-10-CM | POA: Insufficient documentation

## 2012-05-18 DIAGNOSIS — O2342 Unspecified infection of urinary tract in pregnancy, second trimester: Secondary | ICD-10-CM

## 2012-05-18 DIAGNOSIS — N390 Urinary tract infection, site not specified: Secondary | ICD-10-CM | POA: Insufficient documentation

## 2012-05-18 DIAGNOSIS — N949 Unspecified condition associated with female genital organs and menstrual cycle: Secondary | ICD-10-CM | POA: Insufficient documentation

## 2012-05-18 DIAGNOSIS — B9689 Other specified bacterial agents as the cause of diseases classified elsewhere: Secondary | ICD-10-CM | POA: Insufficient documentation

## 2012-05-18 DIAGNOSIS — N760 Acute vaginitis: Secondary | ICD-10-CM | POA: Insufficient documentation

## 2012-05-18 DIAGNOSIS — O2390 Unspecified genitourinary tract infection in pregnancy, unspecified trimester: Secondary | ICD-10-CM | POA: Insufficient documentation

## 2012-05-18 MED ORDER — PHENAZOPYRIDINE HCL 200 MG PO TABS: 200.0000 mg | ORAL_TABLET | Freq: Three times a day (TID) | ORAL | Status: DC

## 2012-05-18 MED ORDER — HYDROMORPHONE HCL PF 1 MG/ML IJ SOLN
1.0000 mg | Freq: Once | INTRAMUSCULAR | Status: AC
  Administered 2012-05-18: 1 mg via INTRAMUSCULAR
  Filled 2012-05-18: qty 1

## 2012-05-18 MED ORDER — METRONIDAZOLE 500 MG PO TABS: 500.0000 mg | ORAL_TABLET | Freq: Two times a day (BID) | ORAL | Status: DC

## 2012-05-18 MED ORDER — CEPHALEXIN 500 MG PO CAPS: 500.0000 mg | ORAL_CAPSULE | Freq: Four times a day (QID) | ORAL | Status: DC

## 2012-05-18 NOTE — MAU Provider Note (Signed)
History     CSN: [id number]  Arrival date and time: 05/18/12 1050   First Provider Initiated Contact with Patient 05/18/12 1129      Chief Complaint  Patient presents with  . Abdominal Pain  . Vaginal Discharge   HPI [name] is a 22 y.o. [phone] at [redacted]w[redacted]d who presents to MAU today with severe pelvic pain and UTI symptoms. The patient was seen on 05/06/12 after a fall and diagnosed with a UTI at that time. Also wet prep had moderate clue cells and flagyl was not given. Patient returns today with increased pelvic pain, dysuria and frequency. She denies back or flank pain, N/V or fever. She is still seeing scant blood with wiping and occasionally in her underwear since the last visit. She completed her Macrobid Rx. Urine culture shows indeterminate sensitivity to Macrobid.    OB History   Grav Para Term Preterm Abortions TAB SAB Ect Mult Living   5 1 1  2 1 1  1 2       Past Medical History  Diagnosis Date  . No pertinent past medical history   . PIH (pregnancy induced hypertension)   . Asthma   . Pregnancy induced hypertension     Past Surgical History  Procedure Laterality Date  . Dilation and curettage of uterus    . Induced abortion      Family History  Problem Relation Age of Onset  . Hypertension Mother   . Cancer Paternal Uncle   . Asthma Sister     History  Substance Use Topics  . Smoking status: Former Smoker -- 0.50 packs/day    Types: Cigarettes  . Smokeless tobacco: Never Used  . Alcohol Use: Yes     Comment: drinks 2-3 glasses of vodka a day on the weekends. States no alcohol since last time here    Allergies:  Allergies  Allergen Reactions  . Shellfish Allergy Itching, Swelling and Rash    Prescriptions prior to admission  Medication Sig Dispense Refill  . albuterol (PROVENTIL HFA;VENTOLIN HFA) 108 (90 BASE) MCG/ACT inhaler Inhale 2 puffs into the lungs every 6 (six) hours as needed (Asthma).      . nitrofurantoin,  macrocrystal-monohydrate, (MACROBID) 100 MG capsule Take 1 capsule (100 mg total) by mouth 2 (two) times daily.  13 capsule  0    Review of Systems  Constitutional: Negative for fever, chills and malaise/fatigue.  Gastrointestinal: Positive for abdominal pain. Negative for nausea, vomiting, diarrhea and constipation.  Genitourinary: Positive for dysuria, urgency and frequency. Negative for flank pain.       + vaginal bleeding + vaginal discharge  Musculoskeletal: Negative for back pain.   Physical Exam   Blood pressure 109/60, pulse 97, temperature 98.3 F (36.8 C), temperature source Oral, resp. rate 20, height 5\' 2"  (1.575 m), weight 117 lb 9.6 oz (53.343 kg), last menstrual period 12/28/2011, SpO2 97.00%.  Physical Exam  Constitutional: She is oriented to person, place, and time. She appears well-developed and well-nourished. No distress.  HENT:  Head: Normocephalic and atraumatic.  Cardiovascular: Normal rate, regular rhythm and normal heart sounds.   Respiratory: Effort normal and breath sounds normal. No respiratory distress.  GI: Soft. Bowel sounds are normal. She exhibits no distension and no mass. There is tenderness (moderate tenderness to palpation of the lower abdomen). There is no rebound, no guarding and no CVA tenderness.  Genitourinary: Uterus is enlarged (appropriate for GA) and tender. Cervix exhibits no motion tenderness, no discharge and  no friability. Right adnexum displays no mass and no tenderness. Left adnexum displays tenderness. Left adnexum displays no mass. There is bleeding (brown tinged discharge noted) around the vagina. Vaginal discharge (scant thin white discharge noted) found.  Neurological: She is alert and oriented to person, place, and time.  Skin: Skin is warm and dry. No erythema.  Psychiatric: She has a normal mood and affect.    MAU Course  Procedures None  MDM Discussed patient with Dr. Jolayne Panther. [place] is not necessary today as placenta was  evaluated on 05/06/12 and bleeding is minimal on exam Urine culture shows > 100,000 E.coli, will send Rx for Keflex and pyridium Rx for Flagyl for BV based on moderate clues from last visit and increased discharge noted  Assessment and Plan  A: UTI in pregnancy Bacterial vaginosis  P: Discharge home Rx for Flagyl, Keflex and pyridium sent to patient's pharmacy Patient advised to increase PO hydration as tolerated Patient encouraged to keep appointment to establish prenatal care in the [facility] clinic as scheduled Patient may return to MAU as needed or if her condition were to change or worsen  Freddi Starr, PA-C  05/18/2012, 11:29 AM

## 2012-05-18 NOTE — MAU Note (Signed)
Patient states she has been having a vaginal discharge with some bleeding since she was seen in MAU on 4-4. States she was treated for a UTI and took all medication. Started having abdominal pain since she was last seen. Occasional nausea and vomiting.

## 2012-05-19 NOTE — MAU Provider Note (Signed)
Attestation of Attending Supervision of Advanced Practitioner (CNM/NP): Evaluation and management procedures were performed by the Advanced Practitioner under my supervision and collaboration.  I have reviewed the Advanced Practitioner's note and chart, and I agree with the management and plan.  Graviel Payeur 05/19/2012 12:51 PM

## 2012-06-01 ENCOUNTER — Encounter (HOSPITAL_COMMUNITY): Payer: Self-pay | Admitting: *Deleted

## 2012-06-01 ENCOUNTER — Observation Stay (HOSPITAL_COMMUNITY)
Admission: AD | Admit: 2012-06-01 | Discharge: 2012-06-02 | Disposition: A | Discharge status: Home / Self Care | Payer: MEDICAID | Source: Ambulatory Visit | Attending: Obstetrics & Gynecology | Admitting: Obstetrics & Gynecology

## 2012-06-01 ENCOUNTER — Inpatient Hospital Stay (HOSPITAL_COMMUNITY): Payer: Self-pay

## 2012-06-01 DIAGNOSIS — IMO0002 Reserved for concepts with insufficient information to code with codable children: Secondary | ICD-10-CM

## 2012-06-01 DIAGNOSIS — O021 Missed abortion: Secondary | ICD-10-CM

## 2012-06-01 HISTORY — DX: Trichomoniasis, unspecified: A59.9

## 2012-06-01 LAB — CBC
MCH: 31.4 pg (ref 26.0–34.0)
MCV: 94.5 fL (ref 78.0–100.0)
Platelets: 294 10*3/uL (ref 150–400)
RDW: 12.8 % (ref 11.5–15.5)

## 2012-06-01 MED ORDER — IBUPROFEN 600 MG PO TABS: 600.0000 mg | ORAL_TABLET | Freq: Four times a day (QID) | ORAL | Status: DC | PRN

## 2012-06-01 MED ORDER — HYDROMORPHONE HCL PF 1 MG/ML IJ SOLN
INTRAMUSCULAR | Status: AC
  Filled 2012-06-01: qty 2

## 2012-06-01 MED ORDER — LACTATED RINGERS IV SOLN: 500.0000 mL | INTRAVENOUS | Status: DC | PRN

## 2012-06-01 MED ORDER — MISOPROSTOL 200 MCG PO TABS
800.0000 ug | ORAL_TABLET | Freq: Once | ORAL | Status: AC
  Administered 2012-06-01: 800 ug via RECTAL
  Filled 2012-06-01: qty 1

## 2012-06-01 MED ORDER — LACTATED RINGERS IV SOLN
INTRAVENOUS | Status: DC
  Administered 2012-06-01: 13:00:00 via INTRAVENOUS

## 2012-06-01 MED ORDER — OXYTOCIN 40 UNITS IN LACTATED RINGERS INFUSION - SIMPLE MED
62.5000 mL/h | INTRAVENOUS | Status: DC
  Administered 2012-06-01: 62.5 mL/h via INTRAVENOUS
  Administered 2012-06-02: 250 mL/h via INTRAVENOUS
  Filled 2012-06-01 (×2): qty 1000

## 2012-06-01 MED ORDER — OXYTOCIN BOLUS FROM INFUSION: 500.0000 mL | INTRAVENOUS | Status: DC

## 2012-06-01 MED ORDER — CITRIC ACID-SODIUM CITRATE 334-500 MG/5ML PO SOLN: 30.0000 mL | ORAL | Status: DC | PRN

## 2012-06-01 MED ORDER — OXYCODONE-ACETAMINOPHEN 5-325 MG PO TABS
1.0000 | ORAL_TABLET | ORAL | Status: DC | PRN
  Administered 2012-06-01: 1 via ORAL
  Filled 2012-06-01: qty 1

## 2012-06-01 MED ORDER — MISOPROSTOL 200 MCG PO TABS
400.0000 ug | ORAL_TABLET | ORAL | Status: DC
  Administered 2012-06-01 (×2): 400 ug via VAGINAL
  Filled 2012-06-01 (×2): qty 2
  Filled 2012-06-01: qty 3

## 2012-06-01 MED ORDER — HYDROMORPHONE HCL PF 1 MG/ML IJ SOLN
2.0000 mg | INTRAMUSCULAR | Status: DC | PRN
  Administered 2012-06-01 (×2): 2 mg via INTRAVENOUS
  Filled 2012-06-01: qty 2

## 2012-06-01 MED ORDER — LIDOCAINE HCL (PF) 1 % IJ SOLN
30.0000 mL | INTRAMUSCULAR | Status: DC | PRN
  Filled 2012-06-01: qty 30

## 2012-06-01 MED ORDER — ONDANSETRON HCL 4 MG/2ML IJ SOLN
4.0000 mg | Freq: Four times a day (QID) | INTRAMUSCULAR | Status: DC | PRN
  Administered 2012-06-01: 4 mg via INTRAVENOUS
  Filled 2012-06-01: qty 2

## 2012-06-01 MED ORDER — FLEET ENEMA 7-19 GM/118ML RE ENEM: 1.0000 | ENEMA | RECTAL | Status: DC | PRN

## 2012-06-01 MED ORDER — ACETAMINOPHEN 325 MG PO TABS: 650.0000 mg | ORAL_TABLET | ORAL | Status: DC | PRN

## 2012-06-01 NOTE — MAU Provider Note (Signed)
History     CSN: [id number]  Arrival date and time: 06/01/12 4098   First Provider Initiated Contact with Patient 06/01/12 1022      Chief Complaint  Patient presents with  . Vaginal Bleeding   HPI [name] is a 22 y.o. [id number] at [redacted]w[redacted]d who presents to MAU today with RLQ pain off and on overnight. It is worse this morning. The patient noticed heavier bleeding this morning. She has had bleeding during this pregnancy, but never this heavy. The bleeding this morning soaked through her clothes. She denies clots, fever or N/V today.   OB History   Grav Para Term Preterm Abortions TAB SAB Ect Mult Living   5 1 1  2 1 1  1 2       Past Medical History  Diagnosis Date  . No pertinent past medical history   . PIH (pregnancy induced hypertension)   . Asthma   . Pregnancy induced hypertension   . Trichomonas     Past Surgical History  Procedure Laterality Date  . Dilation and curettage of uterus    . Induced abortion      Family History  Problem Relation Age of Onset  . Hypertension Mother   . Cancer Paternal Uncle   . Asthma Sister     History  Substance Use Topics  . Smoking status: Former Smoker -- 0.50 packs/day    Types: Cigarettes  . Smokeless tobacco: Never Used  . Alcohol Use: Yes     Comment: drinks 2-3 glasses of vodka a day on the weekends. States no alcohol since last time here    Allergies:  Allergies  Allergen Reactions  . Shellfish Allergy Itching, Swelling and Rash    Prescriptions prior to admission  Medication Sig Dispense Refill  . cephALEXin (KEFLEX) 500 MG capsule Take 1 capsule (500 mg total) by mouth 4 (four) times daily.  20 capsule  0  . phenazopyridine (PYRIDIUM) 200 MG tablet Take 1 tablet (200 mg total) by mouth 3 (three) times daily.  6 tablet  0  . albuterol (PROVENTIL HFA;VENTOLIN HFA) 108 (90 BASE) MCG/ACT inhaler Inhale 2 puffs into the lungs every 6 (six) hours as needed (Asthma).      . metroNIDAZOLE (FLAGYL) 500 MG  tablet Take 1 tablet (500 mg total) by mouth 2 (two) times daily.  14 tablet  0    Review of Systems  Constitutional: Negative for fever and chills.  Gastrointestinal: Positive for abdominal pain. Negative for nausea and vomiting.  Genitourinary:       + vaginal bleeding  Neurological: Positive for dizziness and weakness.   Physical Exam   Blood pressure 130/85, pulse 90, temperature 98.6 F (37 C), temperature source Oral, resp. rate 20, height 5\' 2"  (1.575 m), weight 114 lb (51.71 kg), last menstrual period 12/28/2011.  Physical Exam  Constitutional: She is oriented to person, place, and time. She appears well-developed and well-nourished. No distress.  HENT:  Head: Normocephalic and atraumatic.  Cardiovascular: Normal rate, regular rhythm and normal heart sounds.   Respiratory: Effort normal and breath sounds normal. No respiratory distress.  GI: Soft. Bowel sounds are normal. She exhibits no distension and no mass. There is tenderness (mild tenderness to palpation of the RLQ ). There is no rebound and no guarding.  Genitourinary: Uterus is enlarged (appropriate for GA) and tender. Cervix exhibits no motion tenderness, no discharge and no friability. Right adnexum displays tenderness. Right adnexum displays no mass. Left adnexum displays no  mass and no tenderness. Vaginal discharge (thin, light brown discharge noted in the vaginal vault. No bright red blood noted) found.  Neurological: She is alert and oriented to person, place, and time.  Skin: Skin is warm and dry. No erythema.  Psychiatric: She has a normal mood and affect.     MAU Course  Procedures None  MDM No FHTs with doppler in MAU. [place] today shows no cardiac activity. Consulted Dr. Erin Fulling, consult Wynelle Bourgeois, CNM on L&D for admission and further management.   Assessment and Plan  A: IUFD at 18w 2d  P: Admit to inpatient  Freddi Starr, PA-C  06/01/2012, 10:33 AM

## 2012-06-01 NOTE — Progress Notes (Signed)
06/01/12 1215  Clinical Encounter Type  Visited With Patient;Health care provider  Visit Type Spiritual support;Social support (IUFD/bereavement support)  Referral From Nurse  Recommendations Spiritual Care will follow for support.  Spiritual Encounters  Spiritual Needs Grief support;Emotional  Stress Factors  Patient Stress Factors Loss (IUFD)   Thanks to RN referral, became acquainted with Julia Cain while she was waiting for ultrasound and then for results.  Spent ca 45 minutes providing spiritual, emotional, and logistical support to her as she learned more formally of the loss of her baby and tried to reach family/friends to come to the hospital to be with her.  Provided pastoral presence, listening, witness to her pain and story, early stages of grief education (especially following her questions about how to talk with her 21-year-old twins), affirmation, and consultation with RN, CNM, PA, and MD in MAU.  Julia Cain was very Charity fundraiser.  Any affirmation that medical providers can offer to help with her feelings of guilt ("What did I do? What could I have done?") would help with her emotional processing and healing.  Thank you!  Will follow for support.  [address] [place], [place] [phone]

## 2012-06-01 NOTE — Progress Notes (Signed)
06/01/12 1400  Clinical Encounter Type  Visited With Patient (best friend Julia Cain)  Visit Type Follow-up;Spiritual support;Social support  Spiritual Encounters  Spiritual Needs Emotional;Grief support  Stress Factors  Patient Stress Factors Loss (IUFD)   Made tender follow-up visit with Julia Cain, whose best friend Julia Cain has arrived for support.  Per pt, her mom is planning to get off work early today and will come right over from [place]; her dad, who drives a truck, is in [place] and will come back to [place] as soon as he can.  Julia Cain is finding it very helpful to have comforting pastoral presence, and we plan for me to check in again before I leave this afternoon.    Provided pastoral listening, encouragement, hugs, space to process feelings, and further grief education (including discussing her feelings about and possible benefits of having photos of baby on CD).  Will continue to follow for support, including ongoing comfort and grief education.  [address] [place], [place] [phone]

## 2012-06-01 NOTE — Progress Notes (Signed)
Placenta undelivered, although it feels like it is sitting in the lower uterine segment, but cx is only 2-3cms dilated.  Bleeding small, passage of a few clots (total size of a grapefruit).  VSS.  Cytotec 800 mcg PR and Pitocin infusing at 250cc/hr IV.  Umbilical cord too small to access vein. Will be patient unless bleeding increases to worrisome levels.

## 2012-06-01 NOTE — Progress Notes (Signed)
06/01/12 1600  Clinical Encounter Type  Visited With Patient and family together (Grandmother Gwynneth Albright, sister Karn Pickler?), friend Debbe Odea)  Visit Type Follow-up;Spiritual support;Social support  Spiritual Encounters  Spiritual Needs Emotional;Grief support  Stress Factors  Patient Stress Factors Loss (IUFD)   Followed up again at the close of my shift to offer support, encouragement, and affirmation.  [place] has begun asking questions about delivery and care of baby after delivery; I answered those that relate to spiritual care and referred the medical questions to Camille Bal, RN.  Provided emotional and bereavement support at bedside.  Introduced Biochemist, clinical information verbally, encouraging [place] to reach out as desired.  Provided space for her to begin processing what it may be like to see/hold/make decisions about baby.    If pt is still here at [facility] tomorrow, I will follow up at the beginning of my shift.  Please page [phone] as further care needed/desired.  [address] [place], [place] [phone]

## 2012-06-01 NOTE — Progress Notes (Signed)
Called to room by nurse.  Baby in breech position with SROM, head still entraped in 3cm cervix.  Pt had cytotec per vagina at 1930.  Dr. Penne Lash in to assess.  Will allow cx to continue dilating, adding pitocin and dilaudid for pain.  Pt understands the process.

## 2012-06-01 NOTE — H&P (Signed)
History        CSN: [id number]   Arrival date and time: 06/01/12 4098    First Provider Initiated Contact with Patient 06/01/12 1022          Chief Complaint   Patient presents with   .  Vaginal Bleeding      HPI [name] is a 22 y.o. [id number] at [redacted]w[redacted]d who presents to MAU today with RLQ pain off and on overnight. It is worse this morning. The patient noticed heavier bleeding this morning. She has had bleeding during this pregnancy, but never this heavy. The bleeding this morning soaked through her clothes. She denies clots, fever or N/V today.     OB History     Grav  Para  Term  Preterm  Abortions  TAB  SAB  Ect  Mult  Living     5  1  1    2  1  1    1  2             Past Medical History   Diagnosis  Date   .  No pertinent past medical history     .  PIH (pregnancy induced hypertension)     .  Asthma     .  Pregnancy induced hypertension     .  Trichomonas           Past Surgical History   Procedure  Laterality  Date   .  Dilation and curettage of uterus       .  Induced abortion             Family History   Problem  Relation  Age of Onset   .  Hypertension  Mother     .  Cancer  Paternal Uncle     .  Asthma  Sister           History   Substance Use Topics   .  Smoking status:  Former Smoker -- 0.50 packs/day       Types:  Cigarettes   .  Smokeless tobacco:  Never Used   .  Alcohol Use:  Yes         Comment: drinks 2-3 glasses of vodka a day on the weekends. States no alcohol since last time here        Allergies:   Allergies   Allergen  Reactions   .  Shellfish Allergy  Itching, Swelling and Rash         Prescriptions prior to admission   Medication  Sig  Dispense  Refill   .  cephALEXin (KEFLEX) 500 MG capsule  Take 1 capsule (500 mg total) by mouth 4 (four) times daily.   20 capsule   0   .  phenazopyridine (PYRIDIUM) 200 MG tablet  Take 1 tablet (200 mg total) by mouth 3 (three) times daily.   6 tablet   0   .   albuterol (PROVENTIL HFA;VENTOLIN HFA) 108 (90 BASE) MCG/ACT inhaler  Inhale 2 puffs into the lungs every 6 (six) hours as needed (Asthma).         .  metroNIDAZOLE (FLAGYL) 500 MG tablet  Take 1 tablet (500 mg total) by mouth 2 (two) times daily.   14 tablet   0        Review of Systems  Constitutional: Negative for fever and chills.  Gastrointestinal: Positive for abdominal pain. Negative for nausea and vomiting.  Genitourinary:        +  vaginal bleeding  Neurological: Positive for dizziness and weakness.     Physical Exam      Blood pressure 130/85, pulse 90, temperature 98.6 F (37 C), temperature source Oral, resp. rate 20, height 5\' 2"  (1.575 m), weight 114 lb (51.71 kg), last menstrual period 12/28/2011.   Physical Exam  Constitutional: She is oriented to person, place, and time. She appears well-developed and well-nourished. No distress.  HENT:   Head: Normocephalic and atraumatic.  Cardiovascular: Normal rate, regular rhythm and normal heart sounds.   Respiratory: Effort normal and breath sounds normal. No respiratory distress.  GI: Soft. Bowel sounds are normal. She exhibits no distension and no mass. There is tenderness (mild tenderness to palpation of the RLQ ). There is no rebound and no guarding.  Genitourinary: Uterus is enlarged (appropriate for GA) and tender. Cervix exhibits no motion tenderness, no discharge and no friability. Right adnexum displays tenderness. Right adnexum displays no mass. Left adnexum displays no mass and no tenderness. Vaginal discharge (thin, light brown discharge noted in the vaginal vault. No bright red blood noted) found.  Neurological: She is alert and oriented to person, place, and time.  Skin: Skin is warm and dry. No erythema.  Psychiatric: She has a normal mood and affect.        MAU Course    Procedures None   MDM No FHTs with doppler in MAU. [place] today shows no cardiac activity. Consulted Dr. Erin Fulling, consult Wynelle Bourgeois, CNM on L&D for admission and further management.     Assessment and Plan    A: IUFD at 18w 2d   P: Admit to inpatient   Freddi Starr, PA-C   06/01/2012, 10:33 AM       Dr Pollie Meyer and I both saw patient for admission. She was grieving appropriately and Chaplain was with her for comfort and spiritual care Plan will be to admit her for Cytotec induction of labor Discussed with patient plan of care She agrees to proceed though she is very upset and wondering why she has been here "so many times and everything was ok". Discussed unpredictable nature of fetal demise and that we can have the Pathologist do an exam on the baby after the birth if she wishes. Support offered Wynelle Bourgeois CNM

## 2012-06-01 NOTE — Progress Notes (Addendum)
SVD, nonviable female infant at 2132.  0/0 Apgars.  Footling breech presentation.   Placenta undelivered.  Cord clamped and cut.  Report to F. Cres-Dishman, CRNM.

## 2012-06-01 NOTE — MAU Note (Signed)
Arrived via EMS. States vaginal bleeding started around 0700. Mild cramping throughout the night. Got worse when bleeding started. States blood was through her clothes and down her leg. States has had bleeding off and on throughout pregnancy and has had many MAU visits. States has clinic appointment tomorrow.

## 2012-06-02 ENCOUNTER — Encounter: Payer: Self-pay | Admitting: Obstetrics & Gynecology

## 2012-06-02 LAB — TYPE AND SCREEN
ABO/RH(D): A POS
Antibody Screen: NEGATIVE

## 2012-06-02 MED ORDER — IBUPROFEN 600 MG PO TABS: 600.0000 mg | ORAL_TABLET | Freq: Four times a day (QID) | ORAL | Status: DC | PRN

## 2012-06-02 MED ORDER — DIPHENHYDRAMINE HCL 25 MG PO CAPS
25.0000 mg | ORAL_CAPSULE | Freq: Once | ORAL | Status: AC
  Administered 2012-06-02: 25 mg via ORAL
  Filled 2012-06-02: qty 1

## 2012-06-02 NOTE — Progress Notes (Signed)
06/02/12 1400  Clinical Encounter Type  Visited With Patient;Health care provider (RN, NT)  Visit Type Psychological support;Spiritual support;Social support (continued bereavement support)  Spiritual Encounters  Spiritual Needs Grief support;Emotional   Spent >1 hour providing pastoral presence and logistical support to Julia Cain alongside Tia Alert, RN and Arlyce Dice, [place] as she prepared for discharge and discerned about how she would want her baby cared for after baby's body is returned from pathology.  Julia Cain was tearful and  feeling stress related to family relationships and lack of immediate support available.  Listened, offered hugs and emotional support, helped with funeral home conversation, shared info about community support resources in [employer], and served as a witness to her emotional pain.  Along with NT Marchelle Folks, walked Julia Cain to the cab in MAU to offer love and support as she departed.  [address] [place], [place] [phone]

## 2012-06-02 NOTE — Progress Notes (Signed)
Pt  very upset about having to wait for discharge-MD tied up with a delivery.  Pt attempting to walk home, carrying fetus with her.  Victorino Sparrow present with pt.  Was able to convince pt to wait for d/c papers and make other arrangements for fetus.  Pt eventually discharged home via [employer] with plans to have fetus cremated-baby sent to morgue. Instructed her to call [facility] with name of [facility] when she is able to make arrangements.  Reassured pt that we would not release fetus until she confirmed [facility] plans.  Emotional support provided and pt verbalized understanding of POC.

## 2012-06-02 NOTE — Progress Notes (Signed)
UR chart review completed.  

## 2012-06-02 NOTE — Discharge Summary (Signed)
Physician Discharge Summary  Patient ID: Julia Cain MRN: [id number] DOB/AGE: August 16, 1990 21 y.o.  Admit date: 06/01/2012 Discharge date: 06/02/2012  Admission Diagnoses: 18 week IUFD  Discharge Diagnoses: s/p vaginal delivery of 18 week IUFD Active Problems:   Fetal demise   Discharged Condition: good  Hospital Course: Patient presented to MAU complaining of abdominal pain at 18 weeks and diagnosed with IUFD. Patient was admitted for induction of labor with cytotec and delivered a non-viable infant. Placenta was delivered whole and intact. Patient's postpartum course has been uncomplicated, her vaginal bleeding has been minimal, Postpartum instructions were provided. Patient will follow up at [facility] clinic in 2 weeks.  Consults: None  Significant Diagnostic Studies: radiology: Ultrasound:  intrauterine fetal demise  Treatments: induction of labor   Discharge Exam: Blood pressure 102/56, pulse 75, temperature 98.8 F (37.1 C), temperature source Oral, resp. rate 18, height 5\' 2"  (1.575 m), weight 115 lb (52.164 kg), last menstrual period 12/28/2011, SpO2 100.00%. General appearance: alert, cooperative and no distress Resp: clear to auscultation bilaterally Cardio: regular rate and rhythm, S1, S2 normal, no murmur, click, rub or gallop GI: soft, non-tender; bowel sounds normal; no masses,  no organomegaly Pelvic: minimal lochia Extremities: extremities normal, atraumatic, no cyanosis or edema  Disposition: 01-Home or Self Care     Medication List    STOP taking these medications       cephALEXin 500 MG capsule  Commonly known as:  KEFLEX     metroNIDAZOLE 500 MG tablet  Commonly known as:  FLAGYL     phenazopyridine 200 MG tablet  Commonly known as:  PYRIDIUM      TAKE these medications       albuterol 108 (90 BASE) MCG/ACT inhaler  Commonly known as:  PROVENTIL HFA;VENTOLIN HFA  Inhale 2 puffs into the lungs every 6 (six) hours as needed (Asthma).     ibuprofen 600 MG tablet  Commonly known as:  ADVIL,MOTRIN  Take 1 tablet (600 mg total) by mouth every 6 (six) hours as needed.     ibuprofen 600 MG tablet  Commonly known as:  ADVIL,MOTRIN  Take 1 tablet (600 mg total) by mouth every 6 (six) hours as needed for pain.           Follow-up Information   Follow up with [facility]. Schedule an appointment as soon as possible for a visit in 2 weeks. (for a check up appointment)    Contact information:   [address] [place] [address] [phone]      Signed: Kristoff Coonradt 06/02/2012, 11:46 AM

## 2012-06-02 NOTE — Progress Notes (Signed)
Patient ID: Julia Cain, female   DOB: May 10, 1990, 22 y.o.   MRN: [id number]  Placenta delivered intact with minimal blood loss.  Uterus explored with fingers and not products of conception felt.  Placenta sent to pathology.  No lacerations.

## 2012-06-06 NOTE — H&P (Signed)
Pt seen and examined.  Agree with above note.  Mashanda Ishibashi H. 06/06/2012 3:05 PM

## 2012-06-06 NOTE — MAU Provider Note (Signed)
Pt seen and examined.  Agree with above note.  Thamas Appleyard H. 06/06/2012 3:05 PM  

## 2012-06-16 ENCOUNTER — Ambulatory Visit: Payer: Self-pay | Admitting: Medical

## 2012-08-17 ENCOUNTER — Inpatient Hospital Stay (HOSPITAL_COMMUNITY)
Admission: AD | Admit: 2012-08-17 | Discharge: 2012-08-17 | Disposition: A | Discharge status: Home / Self Care | Payer: Self-pay | Source: Ambulatory Visit | Attending: Obstetrics | Admitting: Obstetrics

## 2012-08-17 ENCOUNTER — Encounter (HOSPITAL_COMMUNITY): Payer: Self-pay

## 2012-08-17 DIAGNOSIS — Z3202 Encounter for pregnancy test, result negative: Secondary | ICD-10-CM | POA: Insufficient documentation

## 2012-08-17 NOTE — MAU Note (Signed)
Patient was not in the lobby when called to triage.

## 2012-08-17 NOTE — MAU Note (Signed)
Patient states she had a fetal loss on 4-30. Was told on 7-1 at the [facility] that she was pregnant. Patient wants confirmation. Denies pain or bleeding. States she has only had one light period on 6-27.

## 2012-08-17 NOTE — MAU Provider Note (Signed)
[name] is a 22 y.o. [id number] who presents to MAU today for confirmation of pregnancy. The patient reported to RN that she had 18 week loss in April and then was in jail [place] early July and was told that UPT was positive there, so she came here for confirmation. Patient reports to pain or bleeding to RN in triage.   BP 114/82  Pulse 67  Temp(Src) 98.1 F (36.7 C) (Oral)  Ht 5' 0.25" (1.53 m)  Wt 112 lb 12.8 oz (51.166 kg)  BMI 21.86 kg/m2  SpO2 100%  LMP 07/29/2012  Breastfeeding? Unknown  Results for orders placed during the hospital encounter of 08/17/12 (from the past 24 hour(s))  HCG, SERUM, QUALITATIVE     Status: None   Collection Time    08/17/12 10:30 AM      Result Value Range   Preg, Serum NEGATIVE  NEGATIVE   MDM Patient left prior to evaluation Will have staff call her with negative results.   A: Negative pregnancy test  P: Call patient with results Patient advised that she may return to MAU as needed  Freddi Starr, PA-C 08/17/2012 11:13 AM

## 2012-11-01 ENCOUNTER — Encounter (HOSPITAL_COMMUNITY): Payer: Self-pay | Admitting: Emergency Medicine

## 2012-11-01 ENCOUNTER — Emergency Department (HOSPITAL_COMMUNITY)
Admission: EM | Admit: 2012-11-01 | Discharge: 2012-11-01 | Disposition: A | Discharge status: Home / Self Care | Payer: Self-pay | Attending: Emergency Medicine | Admitting: Emergency Medicine

## 2012-11-01 DIAGNOSIS — J45909 Unspecified asthma, uncomplicated: Secondary | ICD-10-CM | POA: Insufficient documentation

## 2012-11-01 DIAGNOSIS — M545 Low back pain, unspecified: Secondary | ICD-10-CM | POA: Insufficient documentation

## 2012-11-01 DIAGNOSIS — N390 Urinary tract infection, site not specified: Secondary | ICD-10-CM | POA: Insufficient documentation

## 2012-11-01 DIAGNOSIS — Z79899 Other long term (current) drug therapy: Secondary | ICD-10-CM | POA: Insufficient documentation

## 2012-11-01 DIAGNOSIS — Z8619 Personal history of other infectious and parasitic diseases: Secondary | ICD-10-CM | POA: Insufficient documentation

## 2012-11-01 DIAGNOSIS — Z9889 Other specified postprocedural states: Secondary | ICD-10-CM | POA: Insufficient documentation

## 2012-11-01 DIAGNOSIS — M549 Dorsalgia, unspecified: Secondary | ICD-10-CM

## 2012-11-01 DIAGNOSIS — M546 Pain in thoracic spine: Secondary | ICD-10-CM | POA: Insufficient documentation

## 2012-11-01 DIAGNOSIS — Z87891 Personal history of nicotine dependence: Secondary | ICD-10-CM | POA: Insufficient documentation

## 2012-11-01 DIAGNOSIS — Z3202 Encounter for pregnancy test, result negative: Secondary | ICD-10-CM | POA: Insufficient documentation

## 2012-11-01 HISTORY — DX: Personal history of other complications of pregnancy, childbirth and the puerperium: Z87.59

## 2012-11-01 LAB — POCT PREGNANCY, URINE: Preg Test, Ur: NEGATIVE

## 2012-11-01 LAB — URINALYSIS, ROUTINE W REFLEX MICROSCOPIC
Bilirubin Urine: NEGATIVE
Glucose, UA: NEGATIVE mg/dL
Protein, ur: NEGATIVE mg/dL
Specific Gravity, Urine: 1.031 — ABNORMAL HIGH (ref 1.005–1.030)
Urobilinogen, UA: 1 mg/dL (ref 0.0–1.0)

## 2012-11-01 LAB — URINE MICROSCOPIC-ADD ON

## 2012-11-01 MED ORDER — CEPHALEXIN 500 MG PO CAPS: 500.0000 mg | ORAL_CAPSULE | Freq: Four times a day (QID) | ORAL | Status: DC

## 2012-11-01 NOTE — ED Provider Notes (Signed)
CSN: [id number]     Arrival date & time 11/01/12  1724 History  This chart was scribed for non-physician practitioner, Junious Silk, PA-C working with Shon Baton, MD by Greggory Stallion, ED scribe. This patient was seen in room WTR7/WTR7 and the patient's care was started at 7:08 PM.   Chief Complaint  Patient presents with  . Back Pain  . Abdominal Pain   The history is provided by the patient. No language interpreter was used.    HPI Comments: Julia Cain is a 22 y.o. female who presents to the Emergency Department complaining of gradual onset, gradually worsening sharp upper and lower back pain that started two weeks ago. Pt was laying in bed when the pain started. She states it's gotten worse in the last two days. She states it is worse in the morning. Pt states she is having lower cramping abdominal pain as well. No pain currently. Pt states her last bowel movement was yesterday and it was normal. She denies fever, vaginal discharge, vaginal bleeding, dysuria, frequency, urgency, bowel or bladder incontinence, nausea, emesis, hematochezia and diarrhea. LMP ended 10/20/2012. She denies h/o cancer or recreational drug use.   Past Medical History  Diagnosis Date  . No pertinent past medical history   . PIH (pregnancy induced hypertension)   . Asthma   . Pregnancy induced hypertension   . Trichomonas   . Miscarriage within last 12 months    Past Surgical History  Procedure Laterality Date  . Dilation and curettage of uterus    . Induced abortion     Family History  Problem Relation Age of Onset  . Hypertension Mother   . Cancer Paternal Uncle   . Asthma Sister    History  Substance Use Topics  . Smoking status: Former Smoker -- 0.50 packs/day    Types: Cigarettes  . Smokeless tobacco: Never Used  . Alcohol Use: Yes     Comment: drinks 2-3 glasses of vodka a day on the weekends. States no alcohol since last time here   OB History   Grav Para Term Preterm  Abortions TAB SAB Ect Mult Living   5 1 1  2 1 1  1 2      Review of Systems  Constitutional: Negative for fever.  Gastrointestinal: Positive for abdominal pain. Negative for nausea, vomiting, diarrhea and blood in stool.  Genitourinary: Negative for dysuria, vaginal bleeding and vaginal discharge.       Denies bowel or bladder incontinence.   Musculoskeletal: Positive for back pain.  All other systems reviewed and are negative.    Allergies  Shellfish allergy  Home Medications   Current Outpatient Rx  Name  Route  Sig  Dispense  Refill  . albuterol (PROVENTIL HFA;VENTOLIN HFA) 108 (90 BASE) MCG/ACT inhaler   Inhalation   Inhale 2 puffs into the lungs every 6 (six) hours as needed (Asthma).         . ibuprofen (ADVIL,MOTRIN) 600 MG tablet   Oral   Take 1 tablet (600 mg total) by mouth every 6 (six) hours as needed.   30 tablet   0   . ibuprofen (ADVIL,MOTRIN) 600 MG tablet   Oral   Take 1 tablet (600 mg total) by mouth every 6 (six) hours as needed for pain.   30 tablet   1    BP 92/58  Pulse 80  Temp(Src) 98.7 F (37.1 C) (Oral)  Resp 16  Ht 5\' 2"  (1.575 m)  Wt 110 lb (  49.896 kg)  BMI 20.11 kg/m2  SpO2 98%  LMP 10/20/2012  Physical Exam  Nursing note and vitals reviewed. Constitutional: She is oriented to person, place, and time. She appears well-developed and well-nourished. No distress.  HENT:  Head: Normocephalic and atraumatic.  Right Ear: External ear normal.  Left Ear: External ear normal.  Nose: Nose normal.  Mouth/Throat: Oropharynx is clear and moist.  Eyes: Conjunctivae are normal.  Neck: Normal range of motion.  Cardiovascular: Normal rate, regular rhythm and normal heart sounds.   Pulmonary/Chest: Effort normal and breath sounds normal. No stridor. No respiratory distress. She has no wheezes. She has no rales.  Abdominal: Soft. She exhibits no distension.  No tenderness to deep palpation of abdomen. No CVA tenderness.   Musculoskeletal:  Normal range of motion.  Tenderness along musculature of upper back and lower back. No bony tenderness.   Neurological: She is alert and oriented to person, place, and time. She has normal strength.  Skin: Skin is warm and dry. She is not diaphoretic. No erythema.  Psychiatric: She has a normal mood and affect. Her behavior is normal.    ED Course  Procedures (including critical care time)  DIAGNOSTIC STUDIES: Oxygen Saturation is 98% on RA, normal by my interpretation.    COORDINATION OF CARE: 7:11 PM-Discussed treatment plan which includes treating UTI with pt at bedside and pt agreed to plan.   Labs Review Labs Reviewed  URINALYSIS, ROUTINE W REFLEX MICROSCOPIC - Abnormal; Notable for the following:    APPearance CLOUDY (*)    Specific Gravity, Urine 1.031 (*)    Hgb urine dipstick LARGE (*)    Nitrite POSITIVE (*)    Leukocytes, UA SMALL (*)    All other components within normal limits  URINE MICROSCOPIC-ADD ON - Abnormal; Notable for the following:    Bacteria, UA MANY (*)    All other components within normal limits  URINE CULTURE  POCT PREGNANCY, URINE   Imaging Review No results found.  MDM   1. UTI (lower urinary tract infection)   2. Back pain    Pt has been diagnosed with a UTI. Pt is afebrile, no CVA tenderness, normotensive, and denies N/V. Pt to be dc home with antibiotics and instructions to follow up with PCP if symptoms persist.  Patient with back pain.  No neurological deficits and normal neuro exam.  Patient can walk but states is painful.  No loss of bowel or bladder control.  No concern for cauda equina.  No fever, night sweats, weight loss, h/o cancer, IVDU.  RICE protocol and pain medicine indicated and discussed with patient.     I personally performed the services described in this documentation, which was scribed in my presence. The recorded information has been reviewed and is accurate.    Mora Bellman, PA-C 11/01/12 1943

## 2012-11-01 NOTE — ED Notes (Signed)
Back pain started x1 week. Progressively worse last two days. Lower and upper back pain. Pt was pregnant, lost baby in April. Due date was today. C/o lower belly pain. Denies N/V/D.

## 2012-11-02 NOTE — ED Provider Notes (Signed)
Medical screening examination/treatment/procedure(s) were performed by non-physician practitioner and as supervising physician I was immediately available for consultation/collaboration.  Shon Baton, MD 11/02/12 [phone]

## 2012-11-04 LAB — URINE CULTURE: Colony Count: >=100000

## 2012-11-05 ENCOUNTER — Telehealth (HOSPITAL_COMMUNITY): Payer: Self-pay | Admitting: Emergency Medicine

## 2012-11-05 NOTE — ED Notes (Signed)
Post ED Visit - Positive Culture Follow-up  Culture report reviewed by antimicrobial stewardship pharmacist: []  Wes Dulaney, Pharm.D., BCPS []  Celedonio Miyamoto, Pharm.D., BCPS []  Georgina Pillion, Pharm.D., BCPS []  [name], [address].D., BCPS, AAHIVP []  Estella Husk, Pharm.D., BCPS, AAHIVP [x]  Abran Duke, [address].D., BCPS  Positive urine culture Treated with Kelfex, organism sensitive to the same and no further patient follow-up is required at this time.  Julia Cain 11/05/2012, 3:58 PM

## 2013-02-20 ENCOUNTER — Encounter (HOSPITAL_COMMUNITY): Payer: Self-pay

## 2013-02-20 ENCOUNTER — Inpatient Hospital Stay (HOSPITAL_COMMUNITY)
Admission: AD | Admit: 2013-02-20 | Discharge: 2013-02-20 | Disposition: A | Discharge status: Home / Self Care | Payer: Medicaid Other | Source: Ambulatory Visit | Attending: Obstetrics | Admitting: Obstetrics

## 2013-02-20 DIAGNOSIS — A499 Bacterial infection, unspecified: Secondary | ICD-10-CM | POA: Insufficient documentation

## 2013-02-20 DIAGNOSIS — N926 Irregular menstruation, unspecified: Secondary | ICD-10-CM | POA: Insufficient documentation

## 2013-02-20 DIAGNOSIS — N760 Acute vaginitis: Secondary | ICD-10-CM | POA: Insufficient documentation

## 2013-02-20 DIAGNOSIS — B9689 Other specified bacterial agents as the cause of diseases classified elsewhere: Secondary | ICD-10-CM | POA: Insufficient documentation

## 2013-02-20 LAB — WET PREP, GENITAL: Trich, Wet Prep: NONE SEEN

## 2013-02-20 LAB — URINALYSIS, ROUTINE W REFLEX MICROSCOPIC
BILIRUBIN URINE: NEGATIVE
Glucose, UA: NEGATIVE mg/dL
KETONES UR: NEGATIVE mg/dL
Leukocytes, UA: NEGATIVE
NITRITE: NEGATIVE
Protein, ur: NEGATIVE mg/dL
SPECIFIC GRAVITY, URINE: 1.02 (ref 1.005–1.030)
UROBILINOGEN UA: 0.2 mg/dL (ref 0.0–1.0)
pH: 7 (ref 5.0–8.0)

## 2013-02-20 LAB — URINE MICROSCOPIC-ADD ON

## 2013-02-20 LAB — POCT PREGNANCY, URINE: PREG TEST UR: NEGATIVE

## 2013-02-20 MED ORDER — METRONIDAZOLE 500 MG PO TABS: 500.0000 mg | ORAL_TABLET | Freq: Two times a day (BID) | ORAL | Status: DC

## 2013-02-20 NOTE — Discharge Instructions (Signed)
°  Your pregnancy test is negative. Your wet prep shows that you have bacterial vaginosis. The cultures will not be back for 2 day. Someone will only call you if you need additional medications.   Bacterial Vaginosis Bacterial vaginosis is a vaginal infection that occurs when the normal balance of bacteria in the vagina is disrupted. It results from an overgrowth of certain bacteria. This is the most common vaginal infection in women of childbearing age. Treatment is important to prevent complications, especially in pregnant women, as it can cause a premature delivery. CAUSES  Bacterial vaginosis is caused by an increase in harmful bacteria that are normally present in smaller amounts in the vagina. Several different kinds of bacteria can cause bacterial vaginosis. However, the reason that the condition develops is not fully understood. RISK FACTORS Certain activities or behaviors can put you at an increased risk of developing bacterial vaginosis, including:  Having a new sex partner or multiple sex partners.  Douching.  Using an intrauterine device (IUD) for contraception. Women do not get bacterial vaginosis from toilet seats, bedding, swimming pools, or contact with objects around them. SIGNS AND SYMPTOMS  Some women with bacterial vaginosis have no signs or symptoms. Common symptoms include:  Grey vaginal discharge.  A fishlike odor with discharge, especially after sexual intercourse.  Itching or burning of the vagina and vulva.  Burning or pain with urination. DIAGNOSIS  Your health care provider will take a medical history and examine the vagina for signs of bacterial vaginosis. A sample of vaginal fluid may be taken. Your health care provider will look at this sample under a microscope to check for bacteria and abnormal cells. A vaginal pH test may also be done.  TREATMENT  Bacterial vaginosis may be treated with antibiotic medicines. These may be given in the form of a pill or a  vaginal cream. A second round of antibiotics may be prescribed if the condition comes back after treatment.  HOME CARE INSTRUCTIONS   Only take over-the-counter or prescription medicines as directed by your health care provider.  If antibiotic medicine was prescribed, take it as directed. Make sure you finish it even if you start to feel better.  Do not have sex until treatment is completed.  Tell all sexual partners that you have a vaginal infection. They should see their health care provider and be treated if they have problems, such as a mild rash or itching.  Practice safe sex by using condoms and only having one sex partner. SEEK MEDICAL CARE IF:   Your symptoms are not improving after 3 days of treatment.  You have increased discharge or pain.  You have a fever. MAKE SURE YOU:   Understand these instructions.  Will watch your condition.  Will get help right away if you are not doing well or get worse. FOR MORE INFORMATION  Centers for Disease Control and Prevention, Division of STD Prevention: [employer]s.co.zawww.cdc.gov/std American Sexual Health Association (ASHA): www.ashastd.org  Document Released: 01/19/2005 Document Revised: 11/09/2012 Document Reviewed: 08/31/2012 [facility] Patient Information 2014 [place], [place].
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
## 2013-02-20 NOTE — MAU Note (Signed)
Patient states she has been having irregular periods that last only 2 days. States she has a vaginal discharge for 2-3 days. Denies pain or bleeding.

## 2013-02-21 LAB — GC/CHLAMYDIA PROBE AMP
CT Probe RNA: NEGATIVE
GC Probe RNA: NEGATIVE

## 2013-02-26 ENCOUNTER — Encounter (HOSPITAL_COMMUNITY): Payer: Self-pay | Admitting: Emergency Medicine

## 2013-02-26 ENCOUNTER — Emergency Department (HOSPITAL_COMMUNITY)
Admission: EM | Admit: 2013-02-26 | Discharge: 2013-02-26 | Disposition: A | Discharge status: Home / Self Care | Payer: Medicaid Other | Attending: Emergency Medicine | Admitting: Emergency Medicine

## 2013-02-26 ENCOUNTER — Emergency Department (HOSPITAL_COMMUNITY): Payer: Medicaid Other

## 2013-02-26 DIAGNOSIS — R1031 Right lower quadrant pain: Secondary | ICD-10-CM | POA: Insufficient documentation

## 2013-02-26 DIAGNOSIS — S0083XA Contusion of other part of head, initial encounter: Secondary | ICD-10-CM

## 2013-02-26 DIAGNOSIS — J45909 Unspecified asthma, uncomplicated: Secondary | ICD-10-CM | POA: Insufficient documentation

## 2013-02-26 DIAGNOSIS — O139 Gestational [pregnancy-induced] hypertension without significant proteinuria, unspecified trimester: Secondary | ICD-10-CM | POA: Insufficient documentation

## 2013-02-26 DIAGNOSIS — S56819A Strain of other muscles, fascia and tendons at forearm level, unspecified arm, initial encounter: Secondary | ICD-10-CM

## 2013-02-26 DIAGNOSIS — S0003XA Contusion of scalp, initial encounter: Secondary | ICD-10-CM | POA: Insufficient documentation

## 2013-02-26 DIAGNOSIS — A599 Trichomoniasis, unspecified: Secondary | ICD-10-CM | POA: Insufficient documentation

## 2013-02-26 DIAGNOSIS — R109 Unspecified abdominal pain: Secondary | ICD-10-CM

## 2013-02-26 DIAGNOSIS — M25529 Pain in unspecified elbow: Secondary | ICD-10-CM | POA: Insufficient documentation

## 2013-02-26 DIAGNOSIS — S46919A Strain of unspecified muscle, fascia and tendon at shoulder and upper arm level, unspecified arm, initial encounter: Secondary | ICD-10-CM

## 2013-02-26 DIAGNOSIS — S1093XA Contusion of unspecified part of neck, initial encounter: Secondary | ICD-10-CM

## 2013-02-26 DIAGNOSIS — Z91013 Allergy to seafood: Secondary | ICD-10-CM | POA: Insufficient documentation

## 2013-02-26 DIAGNOSIS — S53499A Other sprain of unspecified elbow, initial encounter: Secondary | ICD-10-CM | POA: Insufficient documentation

## 2013-02-26 DIAGNOSIS — Z87891 Personal history of nicotine dependence: Secondary | ICD-10-CM | POA: Insufficient documentation

## 2013-02-26 DIAGNOSIS — Z8742 Personal history of other diseases of the female genital tract: Secondary | ICD-10-CM | POA: Insufficient documentation

## 2013-02-26 DIAGNOSIS — IMO0001 Reserved for inherently not codable concepts without codable children: Secondary | ICD-10-CM | POA: Insufficient documentation

## 2013-02-26 DIAGNOSIS — R209 Unspecified disturbances of skin sensation: Secondary | ICD-10-CM | POA: Insufficient documentation

## 2013-02-26 LAB — URINALYSIS, ROUTINE W REFLEX MICROSCOPIC
BILIRUBIN URINE: NEGATIVE
Glucose, UA: NEGATIVE mg/dL
Ketones, ur: NEGATIVE mg/dL
Nitrite: POSITIVE — AB
PROTEIN: NEGATIVE mg/dL
Specific Gravity, Urine: 1.027 (ref 1.005–1.030)
UROBILINOGEN UA: 0.2 mg/dL (ref 0.0–1.0)
pH: 6 (ref 5.0–8.0)

## 2013-02-26 LAB — URINE MICROSCOPIC-ADD ON

## 2013-02-26 LAB — PREGNANCY, URINE: PREG TEST UR: NEGATIVE

## 2013-02-26 MED ORDER — SODIUM CHLORIDE 0.9 % IV BOLUS (SEPSIS)
500.0000 mL | Freq: Once | INTRAVENOUS | Status: AC
  Administered 2013-02-26: 14:00:00 via INTRAVENOUS

## 2013-02-26 MED ORDER — IOHEXOL 300 MG/ML  SOLN
100.0000 mL | Freq: Once | INTRAMUSCULAR | Status: AC | PRN
Start: 2013-02-26 — End: 2013-02-26
  Administered 2013-02-26: 100 mL via INTRAVENOUS

## 2013-02-26 MED ORDER — HYDROCODONE-ACETAMINOPHEN 5-325 MG PO TABS: 1.0000 | ORAL_TABLET | Freq: Four times a day (QID) | ORAL | Status: DC | PRN

## 2013-02-26 MED ORDER — CEPHALEXIN 500 MG PO CAPS: 500.0000 mg | ORAL_CAPSULE | Freq: Three times a day (TID) | ORAL | Status: DC

## 2013-02-26 MED ORDER — OXYCODONE-ACETAMINOPHEN 5-325 MG PO TABS
1.0000 | ORAL_TABLET | Freq: Once | ORAL | Status: AC
  Administered 2013-02-26: 1 via ORAL
  Filled 2013-02-26: qty 1

## 2013-02-26 NOTE — Discharge Instructions (Signed)
Your CT scan shows inflammation in your small intestines. There is a possibility that you may be developing a bowel blockage or that you have a bruise to your intestines. If you have worsening pain, vomiting or stop passing gas, return to the ED immediately. Otherwise see your doctor. Do not drive while taking pain medication.   Abdominal Pain, Adult Many things can cause belly (abdominal) pain. Most times, the belly pain is not dangerous. Many cases of belly pain can be watched and treated at home. HOME CARE   Do not take medicines that help you go poop (laxatives) unless told to by your doctor.  Only take medicine as told by your doctor.  Eat or drink as told by your doctor. Your doctor will tell you if you should be on a special diet. GET HELP IF:  You do not know what is causing your belly pain.  You have belly pain while you are sick to your stomach (nauseous) or have runny poop (diarrhea).  You have pain while you pee or poop.  Your belly pain wakes you up at night.  You have belly pain that gets worse or better when you eat.  You have belly pain that gets worse when you eat fatty foods. GET HELP RIGHT AWAY IF:   The pain does not go away within 2 hours.  You have a fever.  You keep throwing up (vomiting).  The pain changes and is only in the right or left part of the belly.  You have bloody or tarry looking poop. MAKE SURE YOU:   Understand these instructions.  Will watch your condition.  Will get help right away if you are not doing well or get worse. Document Released: 07/08/2007 Document Revised: 11/09/2012 Document Reviewed: 09/28/2012 [facility] Patient Information 2014 [place]ExitCare, [place]LL[place].

## 2013-02-26 NOTE — Progress Notes (Signed)
Orthopedic Tech Progress Note Patient Details:  Julia BeamBrittany J Cain March 24, 1990 [id number]  Ortho Devices Type of Ortho Device: Arm sling Ortho Device/Splint Location: RUE Ortho Device/Splint Interventions: Application;Ordered   [name], [name] 02/26/2013, 6:26 PM
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
## 2013-02-26 NOTE — ED Notes (Signed)
Pt. Stated, I started having rt. Side pain and then my rt. Elbow started hurting.Unable to straighten out my arm

## 2013-02-26 NOTE — ED Notes (Signed)
Pt transported to CT scan.

## 2013-02-26 NOTE — ED Provider Notes (Signed)
Medical screening examination/treatment/procedure(s) were performed by non-physician practitioner and as supervising physician I was immediately available for consultation/collaboration.  EKG Interpretation   None         Gwyneth SproutWhitney Doreather Hoxworth, MD 02/26/13 2200
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
## 2013-03-15 ENCOUNTER — Emergency Department (HOSPITAL_COMMUNITY)
Admission: EM | Admit: 2013-03-15 | Discharge: 2013-03-15 | Discharge status: Absent without leave | Payer: Medicaid Other | Source: Home / Self Care

## 2013-03-15 ENCOUNTER — Emergency Department (HOSPITAL_COMMUNITY)
Admission: EM | Admit: 2013-03-15 | Discharge: 2013-03-15 | Disposition: A | Discharge status: Home / Self Care | Payer: Medicaid Other | Attending: Emergency Medicine | Admitting: Emergency Medicine

## 2013-03-15 ENCOUNTER — Encounter (HOSPITAL_COMMUNITY): Payer: Self-pay | Admitting: Emergency Medicine

## 2013-03-15 DIAGNOSIS — Z792 Long term (current) use of antibiotics: Secondary | ICD-10-CM | POA: Insufficient documentation

## 2013-03-15 DIAGNOSIS — K002 Abnormalities of size and form of teeth: Secondary | ICD-10-CM | POA: Insufficient documentation

## 2013-03-15 DIAGNOSIS — Z8679 Personal history of other diseases of the circulatory system: Secondary | ICD-10-CM | POA: Insufficient documentation

## 2013-03-15 DIAGNOSIS — J45909 Unspecified asthma, uncomplicated: Secondary | ICD-10-CM | POA: Insufficient documentation

## 2013-03-15 DIAGNOSIS — Z8619 Personal history of other infectious and parasitic diseases: Secondary | ICD-10-CM | POA: Insufficient documentation

## 2013-03-15 DIAGNOSIS — Z87891 Personal history of nicotine dependence: Secondary | ICD-10-CM | POA: Insufficient documentation

## 2013-03-15 DIAGNOSIS — K0381 Cracked tooth: Secondary | ICD-10-CM | POA: Insufficient documentation

## 2013-03-15 DIAGNOSIS — Z8742 Personal history of other diseases of the female genital tract: Secondary | ICD-10-CM | POA: Insufficient documentation

## 2013-03-15 DIAGNOSIS — K047 Periapical abscess without sinus: Secondary | ICD-10-CM | POA: Insufficient documentation

## 2013-03-15 DIAGNOSIS — R220 Localized swelling, mass and lump, head: Secondary | ICD-10-CM | POA: Insufficient documentation

## 2013-03-15 DIAGNOSIS — R221 Localized swelling, mass and lump, neck: Secondary | ICD-10-CM

## 2013-03-15 DIAGNOSIS — K029 Dental caries, unspecified: Secondary | ICD-10-CM | POA: Insufficient documentation

## 2013-03-15 MED ORDER — PENICILLIN V POTASSIUM 500 MG PO TABS: 500.0000 mg | ORAL_TABLET | Freq: Four times a day (QID) | ORAL | Status: AC

## 2013-03-15 MED ORDER — TRAMADOL HCL 50 MG PO TABS: 50.0000 mg | ORAL_TABLET | Freq: Four times a day (QID) | ORAL | Status: DC | PRN

## 2013-03-15 NOTE — ED Provider Notes (Signed)
Medical screening examination/treatment/procedure(s) were performed by non-physician practitioner and as supervising physician I was immediately available for consultation/collaboration.  EKG Interpretation   None         Neven Fina M Gianlucca Szymborski, DO 03/15/13 1942 

## 2013-03-15 NOTE — ED Notes (Signed)
Per pt 3 months of right lower dental pain but worse over the last week. sts 2 broken teeth

## 2013-03-15 NOTE — ED Provider Notes (Signed)
CSN: [id number]     Arrival date & time 03/15/13  1254 History  This chart [id number] scribed for non-physician practitioner, Sharilyn SitesLisa Sanders, PA-C working with Laray AngerKathleen M McManus, DO by Greggory StallionKayla Andersen, ED scribe. This patient was seen in room TR07C/TR07C and the patient's care was started at 1:31 PM.   Chief Complaint  Patient presents with  . Dental Pain   The history is provided by the patient. No language interpreter was used.   HPI Comments: Julia Cain is a 23 y.o. female who presents to the Emergency Department complaining of gradual onset, constant dental pain with associated swelling that started 3 days ago. Pain worse with chewing on affected side.  No difficulty swallowing.  Pt went to Urgent Care earlier today for the same but left before being seen. She states she has two broken teeth. Pt does not have a dentist.  Has taken OTC meds without noted improvement.  No fevers or chills.  VS stable on arrival.  Past Medical History  Diagnosis Date  . No pertinent past medical history   . PIH (pregnancy induced hypertension)   . Asthma   . Pregnancy induced hypertension   . Trichomonas   . Miscarriage within last 12 months    Past Surgical History  Procedure Laterality Date  . Dilation and curettage of uterus    . Induced abortion     Family History  Problem Relation Age of Onset  . Hypertension Mother   . Cancer Paternal Uncle   . Asthma Sister    History  Substance Use Topics  . Smoking status: Former Smoker -- 0.50 packs/day    Types: Cigarettes  . Smokeless tobacco: Never Used  . Alcohol Use: Yes   OB History   Grav Para Term Preterm Abortions TAB SAB Ect Mult Living   5 1 1  2 1 1  1 2      Review of Systems  HENT: Positive for dental problem and facial swelling.   All other systems reviewed and are negative.   Allergies  Shellfish allergy  Home Medications   Current Outpatient Rx  Name  Route  Sig  Dispense  Refill  . cephALEXin (KEFLEX) 500 MG capsule    Oral   Take 1 capsule (500 mg total) by mouth 3 (three) times daily.   15 capsule   0   . HYDROcodone-acetaminophen (NORCO) 5-325 MG per tablet   Oral   Take 1 tablet by mouth every 6 (six) hours as needed for moderate pain.   12 tablet   0    BP 127/88  Pulse 110  Temp(Src) 98.3 F (36.8 C)  Resp 18  SpO2 98%  LMP 02/14/2013  Physical Exam  Nursing note and vitals reviewed. Constitutional: She is oriented to person, place, and time. She appears well-developed and well-nourished. No distress.  HENT:  Head: Normocephalic and atraumatic.  Mouth/Throat: Uvula is midline, oropharynx is clear and moist and mucous membranes are normal. No trismus in the jaw. Abnormal dentition. Dental abscesses and dental caries present. No uvula swelling. No oropharyngeal exudate, posterior oropharyngeal edema, posterior oropharyngeal erythema or tonsillar abscesses.  Teeth largely in poor dentition, right lower molars broken with large cavities preseut, surrounding gingiva swollen and discolored with dental abscess present; handling secretions appropriately, no trismus  Eyes: Conjunctivae and EOM are normal. Pupils are equal, round, and reactive to light.  Neck: Normal range of motion. Neck supple.  Cardiovascular: Normal rate, regular rhythm and normal heart sounds.  Pulmonary/Chest: Effort normal and breath sounds normal. No respiratory distress.  Musculoskeletal: Normal range of motion.  Neurological: She is alert and oriented to person, place, and time.  Skin: Skin is warm and dry. She is not diaphoretic.  Psychiatric: She has a normal mood and affect.    ED Course  Procedures (including critical care time)  DIAGNOSTIC STUDIES: Oxygen Saturation is 98% on RA, normal by my interpretation.    COORDINATION OF CARE: 1:34 PM-Discussed treatment plan which includes an antibiotic and pain medication with pt at bedside and pt agreed to plan. Advised pt to follow up with a dentist.   Labs  Review Labs Reviewed - No data to display Imaging Review No results found.  EKG Interpretation   None       MDM   Final diagnoses:  Dental abscess   Dental pain with signs of dental abscess. Patient will be started on penicillin and short supply of Norco. She is instructed to followup with dentist, referrals and resource guide given. Discussed plan with patient, she agreed. Return precautions advised.  I personally performed the services described in this documentation, which was scribed in my presence. The recorded information has been reviewed and is accurate.  Garlon Hatchet, PA-C 03/15/13 1456

## 2013-03-15 NOTE — Discharge Instructions (Signed)
Take the prescribed medication as directed. Follow-up with Dr. Leanord AsalFarless or other dentist in the area.  Referral and resource guide provided to help with this. Return to the ED for new or worsening symptoms.

## 2013-05-25 ENCOUNTER — Emergency Department (HOSPITAL_COMMUNITY)
Admission: EM | Admit: 2013-05-25 | Discharge: 2013-05-25 | Disposition: A | Discharge status: Home / Self Care | Payer: Medicaid Other | Attending: Emergency Medicine | Admitting: Emergency Medicine

## 2013-05-25 ENCOUNTER — Encounter (HOSPITAL_COMMUNITY): Payer: Self-pay | Admitting: Emergency Medicine

## 2013-05-25 DIAGNOSIS — Z87891 Personal history of nicotine dependence: Secondary | ICD-10-CM | POA: Insufficient documentation

## 2013-05-25 DIAGNOSIS — K047 Periapical abscess without sinus: Secondary | ICD-10-CM | POA: Insufficient documentation

## 2013-05-25 DIAGNOSIS — J45909 Unspecified asthma, uncomplicated: Secondary | ICD-10-CM | POA: Insufficient documentation

## 2013-05-25 DIAGNOSIS — Z8679 Personal history of other diseases of the circulatory system: Secondary | ICD-10-CM | POA: Insufficient documentation

## 2013-05-25 DIAGNOSIS — Z8619 Personal history of other infectious and parasitic diseases: Secondary | ICD-10-CM | POA: Insufficient documentation

## 2013-05-25 DIAGNOSIS — R6884 Jaw pain: Secondary | ICD-10-CM | POA: Insufficient documentation

## 2013-05-25 MED ORDER — IBUPROFEN 800 MG PO TABS: 800.0000 mg | ORAL_TABLET | Freq: Three times a day (TID) | ORAL | Status: DC

## 2013-05-25 MED ORDER — PENICILLIN V POTASSIUM 500 MG PO TABS: 500.0000 mg | ORAL_TABLET | Freq: Four times a day (QID) | ORAL | Status: AC

## 2013-05-25 MED ORDER — TRAMADOL HCL 50 MG PO TABS: 50.0000 mg | ORAL_TABLET | Freq: Four times a day (QID) | ORAL | Status: DC | PRN

## 2013-05-25 NOTE — Discharge Instructions (Signed)
Dental Abscess °A dental abscess is a collection of infected fluid (pus) from a bacterial infection in the inner part of the tooth (pulp). It usually occurs at the end of the tooth's root.  °CAUSES  °· Severe tooth decay. °· Trauma to the tooth that allows bacteria to enter into the pulp, such as a broken or chipped tooth. °SYMPTOMS  °· Severe pain in and around the infected tooth. °· Swelling and redness around the abscessed tooth or in the mouth or face. °· Tenderness. °· Pus drainage. °· Bad breath. °· Bitter taste in the mouth. °· Difficulty swallowing. °· Difficulty opening the mouth. °· Nausea. °· Vomiting. °· Chills. °· Swollen neck glands. °DIAGNOSIS  °· A medical and dental history will be taken. °· An examination will be performed by tapping on the abscessed tooth. °· X-rays may be taken of the tooth to identify the abscess. °TREATMENT °The goal of treatment is to eliminate the infection. You may be prescribed antibiotic medicine to stop the infection from spreading. A root canal may be performed to save the tooth. If the tooth cannot be saved, it may be pulled (extracted) and the abscess may be drained.  °HOME CARE INSTRUCTIONS °· Only take over-the-counter or prescription medicines for pain, fever, or discomfort as directed by your caregiver. °· Rinse your mouth (gargle) often with salt water (¼ tsp salt in 8 oz [250 ml] of warm water) to relieve pain or swelling. °· Do not drive after taking pain medicine (narcotics). °· Do not apply heat to the outside of your face. °· Return to your dentist for further treatment as directed. °SEEK MEDICAL CARE IF: °· Your pain is not helped by medicine. °· Your pain is getting worse instead of better. °SEEK IMMEDIATE MEDICAL CARE IF: °· You have a fever or persistent symptoms for more than 2 3 days. °· You have a fever and your symptoms suddenly get worse. °· You have chills or a very bad headache. °· You have problems breathing or swallowing. °· You have trouble  opening your mouth. °· You have swelling in the neck or around the eye. °Document Released: 01/19/2005 Document Revised: 10/14/2011 Document Reviewed: 04/29/2010 °ExitCare® Patient Information ©2014 [employer]. ° ° °Emergency Department Resource Guide °1) Find a Doctor and Pay Out of Pocket °Although you won't have to find out who is covered by your insurance plan, it is a good idea to ask around and get recommendations. You will then need to call the office and see if the doctor you have chosen will accept you as a new patient and what types of options they offer for patients who are self-pay. Some doctors offer discounts or will set up payment plans for their patients who do not have insurance, but you will need to ask so you aren't surprised when you get to your appointment. ° °2) Contact Your Local Health Department °Not all health departments have doctors that can see patients for sick visits, but many do, so it is worth a call to see if yours does. If you don't know where your local health department is, you can check in your phone book. The CDC also has a tool to help you locate your state's health department, and many state websites also have listings of all of their local health departments. ° °3) Find a Walk-in Clinic °If your illness is not likely to be very severe or complicated, you may want to try a walk in clinic. These are popping up all over   the country in pharmacies, drugstores, and shopping centers. They're usually staffed by nurse practitioners or physician assistants that have been trained to treat common illnesses and complaints. They're usually fairly quick and inexpensive. However, if you have serious medical issues or chronic medical problems, these are probably not your best option. ° °No Primary Care Doctor: °- Call [facility] at  832-8000 - they can help you locate a primary care doctor that  accepts your insurance, provides certain services, etc. °- Physician Referral Service-  1-800-533-3463 ° °Chronic Pain Problems: °Organization         Address  Phone   Notes  °[facility]  (336) 297-2271 Patients need to be referred by their primary care doctor.  ° °Medication Assistance: °Organization         Address  Phone   Notes  °[place] [facility] [address] °[place], [place] [address] (336) 641-8030 --Must be a resident of [place] °-- Must have NO insurance coverage whatsoever (no Medicaid/ Medicare, etc.) °-- The pt. MUST have a primary care doctor that directs their care regularly and follows them in the community °  °[facility]  (866) 331-1348   °[employer]  (888) 892-1162   ° °Agencies that provide inexpensive medical care: °Organization         Address  Phone   Notes  °[facility]  (336) 832-8035   °[facility]    (336) 832-7272   °[facility] [address] °[place], [place] [address] (336) 832-4777   °[facility] [address], °[place] (336) 271-4999   °[facility]    (336) 373-0678   °[facility]    (336) 272-1050   °[facility] ° [address], [place] Phone:  (336) 832-4444, Fax:  (336) 832-4440 Hours of Operation:  9 am - 6 pm, M-F.  Also accepts Medicaid/Medicare and self-pay.  °[facility] ° [address], [place] Phone: (336) 832-3150, Fax: (336) 832-3151. Hours of Operation:  8:30 am - 5:30 pm, M-F.  Also accepts Medicaid and self-pay.  °[facility] [place] [address], [place] Phone: (336) 878-6027   °[facility] [address], [place], [place] (336)723-1848, Ext. 123 Mondays & Thursdays: 7-9 AM.  First 15 patients are seen on a first come, first serve basis. °  ° °Medicaid-accepting [place] Providers: ° °Organization         Address  Phone   Notes  °[facility] [address],  [place] (336) 641-2100 Also accepts self-pay patients.  °[facility] [address], [place] ° (336) 856-9996   °[facility] [address], [place] (336) 288-8857   °[facility] [address] [place] [address], [place] (336) 299-7000   °Veita Bland [address], [place]  ° (336) 373-1557 Only accepts [place] Access Medicaid patients after they have their name applied to their card.  ° °Self-Pay (no insurance) in [place]: ° °Organization         Address  Phone   Notes  °Sickle Cell Patients, [facility] [address], [place] (336) 832-1970   °[facility] [address], [place] (336) 832-4400   °[facility] ° [address], [place] (336) 992-4800   °[facility]/Dr. Osei-Bonsu ° [address] [place] [address], [place] or [address], [place] (336) 841-8500 Phone number for both [place] and [place] locations is the same.  °[facility] [address], [place] (336) 299-0000   °[facility] [address] [place] [address], [place] or [address] (336) 852-7530 °(336) 878-2260   °[facility] [address], [place] (336) 350-1642, phone; (336) 294-5005, fax Sees patients 1st and 3rd Saturday of every month.  Must not qualify for public or private insurance (i.e. Medicaid, Medicare, [place] Health Choice, Veterans' Benefits) • Household income should be no more than 200% of the poverty level •The clinic cannot treat you if you are pregnant or think you are pregnant • Sexually transmitted diseases are not treated at the clinic.  ° ° °Dental Care: °Organization         Address  Phone  Notes  °[facility] [address], [place] (336) 641-6152 Accepts children up to age 21 who are enrolled in  Medicaid or [place] Health Choice; pregnant women with a Medicaid card; and children who have applied for Medicaid or [place] Health Choice, but were declined, whose parents can pay a reduced fee at time of service.  °[facility] [place]  [address], [place] (336) 641-7733 Accepts children up to age 21 who are enrolled in Medicaid or [place] Health Choice; pregnant women with a Medicaid card; and children who have applied for Medicaid or [place] Health Choice, but were declined, whose parents can pay a reduced fee at time of service.  °[facility] ° [address], [place] (336) 641-4533 Patients are seen by appointment only. Walk-ins are not accepted. [facility] will see patients 18 years of age and older. °Monday - Tuesday (8am-5pm) °Most Wednesdays (8:30-5pm) °$30 per visit, cash only  °[facility] ° [address], [place] (336) 641-4533 Patients are seen by appointment only. Walk-ins are not accepted. [facility] will see patients 18 years of age and older. °One Wednesday Evening (Monthly: Volunteer Based).  $30 per visit, cash only  °[facility]  (919) 537-3737 for adults; Children under age 4, call Graduate Pediatric Dentistry at (919) [phone]. Children aged 4-14, please call (919) 537-3737 to request a pediatric application. ° Dental services are provided in all areas of dental care including fillings, crowns and bridges, complete and partial dentures, implants, gum treatment, root canals, and extractions. Preventive care is also provided. Treatment is provided to both adults and children. °Patients are selected via a lottery and there is often a waiting list. °  °[facility] [address], °[place] ° (336) 763-8833 [email or web address] °  °[facility] [address], [place], [place] (336)723-1848, Ext. 123 Second and Fourth Thursday of each month, opens at 6:30  AM; Clinic ends at 9 AM.  Patients are seen on a first-come first-served basis, and a limited number are seen during each clinic.  ° °[facility] ° [address], [place], [place] (336) 723-7904   Eligibility Requirements °You must have lived in [place], [place], or [place] counties for at least the last three months. °  You cannot be eligible for state or federal sponsored healthcare insurance, including Veterans Administration, Medicaid, or Medicare. °    You generally cannot be eligible for healthcare insurance through your employer.  °  How to apply: °Eligibility screenings are held every Tuesday and Wednesday afternoon from 1:00 pm until 4:00 pm. You do not need an appointment for the interview!  °[facility] [address], [place], [place] 336-631-2330   °[facility]  336-342-8273   °[facility]  336-703-3100   °[facility]  336-570-6415   ° °Behavioral Health Resources in the Community: °Intensive Outpatient Programs °Organization         Address  Phone  Notes  °[facility] [address], [place], [place] 336-878-6098   °[facility] [address], [place], [place] 336-832-9800   °[facility] [address], [place], [place] ° 336-882-2125   °[facility] [address],  °[place], [place] 1-800-853-5163 or 336-641-4981   °Substance Abuse Resources °Organization         Address  Phone  Notes  °[facility]  336-882-2125   °[facility]  336-784-9470   °[facility]  336-285-9073   °[facility]  336-845-3988   °Residential & Outpatient Substance Abuse Program  1-800-659-3381   °Psychological Services °Organization         Address  Phone  Notes  °[facility]  336- 832-9600   °[facility]  336- 378-7881   °[facility] [address], [place] 1-800-853-5163 or  336-641-4981   ° °Mobile Crisis Teams °Organization         Address  Phone  Notes  °[facility], Mobile Crisis Care Unit  1-877-626-1772   °Assertive °[facility] ° [address] [place], [place] 336-834-9664   °Sharon DeEsch [address] °[place] 336-554-5454   ° °Self-Help/Support Groups °Organization         Address  Phone             Notes  °[facility] - variety of support groups  336- 373-1402 Call for more information  °Narcotics Anonymous (NA), [facility] [address], °[place]  2 meetings at this location  ° °Residential Treatment Programs °Organization         Address  Phone  Notes  °[facility] [address],    °[place]  1-866-801-8205   °[facility] ° [address], [place], [place] 704-293-8524   °[facility] [address], [place] 336-845-3988 Admissions: 8am-3pm M-F  °[facility] [address]    °[place], [place] 336-841-1104   °[facility] [address], [place], [place] 336-379-7146   °[facility] [address]  °[place], [place] 336-285-9073   °[facility] Intensive Outpatient [address], [place], [place] 336-852-3033   °[facility] [address]  °[place], [place] 1-877-615-2722 or 336-784-9470   °[facility] [address] [place], [place] 336-227-7417 Accepts Medicaid  °[facility] [address]  °[place] 1-800-659-3381 Substance Abuse/Addiction Treatment  ° °[place] Behavioral Health Resources °Organization         Address  Phone  Notes  °[facility]  (888) 581-9988   °Julie Brannon, PhD [address] [place],    (336) 349-5553 or (336) 951-0000   °[facility]   [address] °[place], [place] (336) 349-4454   °[facility] [address],  [place], [place] (336) 342-8316 Insurance/Medicaid/sponsorship through [facility]  °[facility] [address]                                    [place], [place] (336) 342-8316 Therapy/tele-psych/case  °[facility] [address]  ° [place], [place] (336) 349-2233    °Dr. Arfeen  (336) 349-4544   °[facility] [place]  [employer] [place] [facility] 1) [address], [place] °2) [address], [place] °3)  [address], [place] (336) 349-3220 °(336) 342-7768 ° °(336) 342-8140   °[place] [facility] (336) 342-1394 or (336) 342-3537 (After Hours)    ° ° ° °
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
## 2013-05-25 NOTE — ED Provider Notes (Signed)
CSN: [id number]     Arrival date & time 05/25/13  0225 History   First MD Initiated Contact with Patient 05/25/13 [phone]     Chief Complaint  Patient presents with  . Dental Pain  . Jaw Pain     (Consider location/radiation/quality/duration/timing/severity/associated sxs/prior Treatment) HPI History provided by patient. Left upper molar dental pain on and off for some time, worse over the last 2 days. Hurts to chew. Pain sharp and moderate to severe. No fevers. No vomiting. No difficulty breathing or difficulty swallowing. No known alleviating factors. She does not have a Education officer, communitydentist. Past Medical History  Diagnosis Date  . No pertinent past medical history   . PIH (pregnancy induced hypertension)   . Asthma   . Pregnancy induced hypertension   . Trichomonas   . Miscarriage within last 12 months    Past Surgical History  Procedure Laterality Date  . Dilation and curettage of uterus    . Induced abortion     Family History  Problem Relation Age of Onset  . Hypertension Mother   . Cancer Paternal Uncle   . Asthma Sister    History  Substance Use Topics  . Smoking status: Former Smoker -- 0.50 packs/day    Types: Cigarettes  . Smokeless tobacco: Never Used  . Alcohol Use: Yes   OB History   Grav Para Term Preterm Abortions TAB SAB Ect Mult Living   5 1 1  2 1 1  1 2      Review of Systems  Constitutional: Negative for fever and chills.  HENT: Positive for dental problem. Negative for facial swelling.   Eyes: Negative for pain.  Respiratory: Negative for shortness of breath.   Cardiovascular: Negative for chest pain.  Gastrointestinal: Negative for abdominal pain.  Musculoskeletal: Negative for neck pain and neck stiffness.  Skin: Negative for rash.  Neurological: Negative for headaches.  All other systems reviewed and are negative.     Allergies  Shellfish allergy  Home Medications   Prior to Admission medications   Medication Sig Start Date End Date Taking?  Authorizing Provider  acetaminophen (TYLENOL) 500 MG tablet Take 1,000 mg by mouth every 8 (eight) hours as needed.   Yes Historical Provider, MD   BP 98/50  Pulse 71  Temp(Src) 98.3 F (36.8 C) (Oral)  Resp 16  Ht 5\' 2"  (1.575 m)  Wt 114 lb (51.71 kg)  BMI 20.85 kg/m2  SpO2 97%  LMP 04/14/2013 Physical Exam  Constitutional: She is oriented to person, place, and time. She appears well-developed and well-nourished.  HENT:  Head: Normocephalic and atraumatic.  Mouth/Throat: Oropharynx is clear and moist.  Uvula midline no trismus. Left upper third molar tender without gingival swelling or fluctuance. No associated facial swelling/ erythema.  Eyes: EOM are normal. Pupils are equal, round, and reactive to light.  Neck: Neck supple.  Cardiovascular: Regular rhythm and intact distal pulses.   Pulmonary/Chest: Effort normal. No respiratory distress.  Musculoskeletal: Normal range of motion. She exhibits no edema.  Lymphadenopathy:    She has no cervical adenopathy.  Neurological: She is alert and oriented to person, place, and time.  Skin: Skin is warm and dry.    ED Course  Dental Date/Time: 05/25/2013 6:00 AM Performed by: Sunnie NielsenPITZ, Rosealee Recinos Authorized by: Sunnie NielsenPITZ, Nathanie Ottley Consent: Verbal consent obtained. Risks and benefits: risks, benefits and alternatives were discussed Consent given by: patient Patient understanding: patient states understanding of the procedure being performed Required items: required blood products, implants, devices, and special  equipment available Patient identity confirmed: verbally with patient Time out: Immediately prior to procedure a "time out" was called to verify the correct patient, procedure, equipment, support staff and site/side marked as required. Preparation: Patient was prepped and draped in the usual sterile fashion. Local anesthesia used: yes Local anesthetic: bupivacaine 0.5% with epinephrine Anesthetic total: 1.8 ml Patient tolerance: Patient  tolerated the procedure well with no immediate complications. Comments: Local injection to left upper third molar with good anesthesia achieved   (including critical care time) Labs Review Labs Reviewed - No data to display  Room air pulse ox 100% is adequate.  Plan discharge home with outpatient followup. Prescription for penicillin Motrin provided. Patient agrees to return precautions and is stable and appropriate for discharge at this time.  MDM   Diagnosis: Dental abscess  Dental block performed Pain improved Vital signs nurse's notes reviewed and considered.   Sunnie NielsenBrian Herminia Warren, MD 05/25/13 [phone]

## 2013-05-25 NOTE — ED Notes (Signed)
Pt. reports left upper/lower molar pain and left jaw pain onset last week .

## 2013-07-26 ENCOUNTER — Encounter (HOSPITAL_COMMUNITY): Payer: Self-pay | Admitting: Emergency Medicine

## 2013-07-26 ENCOUNTER — Emergency Department (HOSPITAL_COMMUNITY)
Admission: EM | Admit: 2013-07-26 | Discharge: 2013-07-26 | Disposition: A | Discharge status: Home / Self Care | Payer: Medicaid Other | Attending: Emergency Medicine | Admitting: Emergency Medicine

## 2013-07-26 ENCOUNTER — Emergency Department (HOSPITAL_COMMUNITY): Payer: Medicaid Other

## 2013-07-26 DIAGNOSIS — W010XXA Fall on same level from slipping, tripping and stumbling without subsequent striking against object, initial encounter: Secondary | ICD-10-CM | POA: Insufficient documentation

## 2013-07-26 DIAGNOSIS — Y9289 Other specified places as the place of occurrence of the external cause: Secondary | ICD-10-CM | POA: Insufficient documentation

## 2013-07-26 DIAGNOSIS — S51809A Unspecified open wound of unspecified forearm, initial encounter: Secondary | ICD-10-CM | POA: Insufficient documentation

## 2013-07-26 DIAGNOSIS — Y9389 Activity, other specified: Secondary | ICD-10-CM | POA: Insufficient documentation

## 2013-07-26 DIAGNOSIS — Z3202 Encounter for pregnancy test, result negative: Secondary | ICD-10-CM | POA: Insufficient documentation

## 2013-07-26 DIAGNOSIS — F17200 Nicotine dependence, unspecified, uncomplicated: Secondary | ICD-10-CM | POA: Insufficient documentation

## 2013-07-26 DIAGNOSIS — J45909 Unspecified asthma, uncomplicated: Secondary | ICD-10-CM | POA: Insufficient documentation

## 2013-07-26 DIAGNOSIS — Z23 Encounter for immunization: Secondary | ICD-10-CM | POA: Insufficient documentation

## 2013-07-26 DIAGNOSIS — IMO0002 Reserved for concepts with insufficient information to code with codable children: Secondary | ICD-10-CM

## 2013-07-26 DIAGNOSIS — Z8619 Personal history of other infectious and parasitic diseases: Secondary | ICD-10-CM | POA: Insufficient documentation

## 2013-07-26 LAB — POC URINE PREG, ED: Preg Test, Ur: NEGATIVE

## 2013-07-26 MED ORDER — TETANUS-DIPHTH-ACELL PERTUSSIS 5-2.5-18.5 LF-MCG/0.5 IM SUSP
0.5000 mL | Freq: Once | INTRAMUSCULAR | Status: AC
  Administered 2013-07-26: 0.5 mL via INTRAMUSCULAR
  Filled 2013-07-26: qty 0.5

## 2013-07-26 MED ORDER — HYDROCODONE-ACETAMINOPHEN 5-325 MG PO TABS: 1.0000 | ORAL_TABLET | Freq: Four times a day (QID) | ORAL | Status: DC | PRN

## 2013-07-26 NOTE — ED Notes (Signed)
PA at bedside to suture.

## 2013-07-26 NOTE — ED Notes (Signed)
Pt's wound soaked and cleaned. PA to suture.

## 2013-07-26 NOTE — ED Provider Notes (Signed)
CSN: [id number]     Arrival date & time 07/26/13  1826 History  This chart was scribed for Raymon Mutton, PA-C working with Ward Givens, MD by Evon Slack, ED Scribe. This patient was seen in room TR08C/TR08C and the patient's care was started at 6:36 PM.    Chief Complaint  Patient presents with  . Extremity Laceration   The history is provided by the patient. No language interpreter was used.   Julia Cain is a 23 y.o. female with a history of asthma, PIH, and Trichomanas She present to ED with lacerations on right forearm onset 30 minutes prior. She states she feel through a glass coffee table after tripping over her daughter's play toy. Stated that when she tripped over the toy she put her arms out to catch herself and she fell through the table. Reported that she had a lot of bleeding-reported that she got the bleeding under control with pressure application. Reported that she has a stinging sensation and mild tingling with areas of laceration located. Stated that she's not up to date with her tetanus shot. Denied numbness, tingling, loss of sensation, weakness, dizziness, loss of vision, blurred vision, headache, head injury, loss of consciousness. PCP Dr. Gaynell Face  Past Medical History  Diagnosis Date  . No pertinent past medical history   . PIH (pregnancy induced hypertension)   . Asthma   . Pregnancy induced hypertension   . Trichomonas   . Miscarriage within last 12 months    Past Surgical History  Procedure Laterality Date  . Dilation and curettage of uterus    . Induced abortion     Family History  Problem Relation Age of Onset  . Hypertension Mother   . Cancer Paternal Uncle   . Asthma Sister    History  Substance Use Topics  . Smoking status: Current Every Day Smoker -- 3.00 packs/day    Types: Cigarettes  . Smokeless tobacco: Never Used  . Alcohol Use: Yes   OB History   Grav Para Term Preterm Abortions TAB SAB Ect Mult Living   5 1 1  2 1 1  1 2       Review of Systems  Skin: Positive for wound.  Neurological: Negative for syncope and headaches.      Allergies  Shellfish allergy  Home Medications   Prior to Admission medications   Medication Sig Start Date End Date Taking? Authorizing Provider  HYDROcodone-acetaminophen (NORCO/VICODIN) 5-325 MG per tablet Take 1 tablet by mouth every 6 (six) hours as needed for moderate pain or severe pain. 07/26/13   Marissa Sciacca, PA-C   Triage Vitals: BP 115/79  Pulse 102  Temp(Src) 98.5 F (36.9 C) (Oral)  Resp 20  SpO2 100%  LMP 06/10/2013  Physical Exam  Nursing note and vitals reviewed. Constitutional: She is oriented to person, place, and time. She appears well-developed and well-nourished. No distress.  HENT:  Head: Normocephalic and atraumatic.  Right Ear: External ear normal.  Left Ear: External ear normal.  Nose: Nose normal.  Mouth/Throat: Oropharynx is clear and moist. No oropharyngeal exudate.  Negative facial trauma Negative palpation hematomas  Negative crepitus or depression palpated to the skull/maxillary region Negative damage noted to dentition  Eyes: Conjunctivae and EOM are normal. Pupils are equal, round, and reactive to light. Right eye exhibits no discharge. Left eye exhibits no discharge.  Negative nystagmus Visual fields grossly intact Negative crepitus upon palpation to the orbital Negative signs of entrapment  Neck: Normal range of  motion. Neck supple. No tracheal deviation present.  Negative neck stiffness Negative nuchal rigidity Negative cervical lymphadenopathy Negative pain upon palpation to the c-spine  Cardiovascular: Normal rate, regular rhythm and normal heart sounds.  Exam reveals no friction rub.   No murmur heard. Pulses:      Radial pulses are 2+ on the right side, and 2+ on the left side.       Dorsalis pedis pulses are 2+ on the right side, and 2+ on the left side.  Cap refill less than 3 seconds  Pulmonary/Chest: Effort  normal and breath sounds normal. No respiratory distress. She has no wheezes. She has no rales. She exhibits no tenderness.  Negative ecchymosis Negative pain upon palpation to the chest wall Negative crepitus upon palpation to the chest wall Patient is able to speak in full sentences without difficulty Negative use of accessory muscles Negative stridor  Musculoskeletal: Normal range of motion. She exhibits no tenderness.       Arms: Full ROM to upper and lower extremities without difficulty noted, negative ataxia noted.  Lymphadenopathy:    She has no cervical adenopathy.  Neurological: She is alert and oriented to person, place, and time. No cranial nerve deficit. She exhibits normal muscle tone. Coordination normal.  Cranial nerves III-XII grossly intact Strength 5+/5+ to upper extremities bilaterally with resistance applied, equal distribution noted Mildly diminished right grip strength secondary to pain and lacerations in the right hand Sensation intact with differentiation sharp and dull touch Equal grip strength Negative facial drooping Negative slurred speech Negative aphasia Strength intact to MCP, PIP, DIP joints of right hand Gait proper, proper balance - negative sway, negative drift, negative step-offs  Skin: Skin is warm and dry. No rash noted. She is not diaphoretic. No erythema.  Approximate 3 cm lacerations identified to the lateral aspect of the right forearm, x2. Bleeding controlled. Negative involvement of tendon and deep tendon. Superficial abrasions identified to the right hand.  Psychiatric: She has a normal mood and affect. Her behavior is normal. Thought content normal.    ED Course  Procedures (including critical care time) DIAGNOSTIC STUDIES: Oxygen Saturation is 100% on RA, normal by my interpretation.    COORDINATION OF CARE:  8:17 PM Dr. Lars MageI. Knapp at bedside assessing patient.   Results for orders placed during the hospital encounter of 07/26/13   POC URINE PREG, ED      Result Value Ref Range   Preg Test, Ur NEGATIVE  NEGATIVE    Labs Review Labs Reviewed  POC URINE PREG, ED    Imaging Review Dg Forearm Right  07/26/2013   CLINICAL DATA:  Larey SeatFell.  Laceration.  EXAM: RIGHT FOREARM - 2 VIEW  COMPARISON:  None.  FINDINGS: The wrist and elbow joints are maintained. No acute forearm fracture. The overlying bandage causes moderate artifact. No obvious radiopaque foreign body.  IMPRESSION: No acute fracture or obvious radiopaque foreign body.   Electronically Signed   By: Loralie ChampagneMark  Gallerani M.D.   On: 07/26/2013 19:30   Dg Hand Complete Right  07/26/2013   CLINICAL DATA:  Fall,  glass laceration  EXAM: RIGHT HAND - COMPLETE 3+ VIEW  COMPARISON:  None.  FINDINGS: There is no fracture of the distal radius or ulna. No carpal fracture or metacarpal fracture. No radiodense foreign body. Bandage material overlies the wrist.  IMPRESSION: No fracture or foreign body.  Bandage material overlies wrist.   Electronically Signed   By: Genevive BiStewart  Edmunds M.D.   On: 07/26/2013 19:30  LACERATION REPAIR Performed by: Raymon MuttonSciacca, Marissa Authorized by: Raymon MuttonSciacca, Marissa Consent: Verbal consent obtained. Risks and benefits: risks, benefits and alternatives were discussed Consent given by: patient Patient identity confirmed: provided demographic data Prepped and Draped in normal sterile fashion Wound explored Laceration Location: right forearm, lateral aspect Laceration Length: 3 cm each, x 2 No Foreign Bodies seen or palpated Anesthesia: local infiltration Local anesthetic: lidocaine 2% without epinephrine Anesthetic total: 10 ml Irrigation method: syringe Amount of cleaning: extensive Skin closure: approximate Number of sutures: 2 subcutaneous using Vicryl, 22 superificial with prolene 4-0 Technique: single interuppted Patient tolerance: Patient tolerated the procedure well with no immediate complications. Bacitracin ointment and guaze placed.    EKG  Interpretation None      MDM   Final diagnoses:  Laceration   Medications  Tdap (BOOSTRIX) injection 0.5 mL (0.5 mLs Intramuscular Given 07/26/13 1933)   Filed Vitals:   07/26/13 1833 07/26/13 1857  BP: 115/79 112/57  Pulse: 102 97  Temp: 98.5 F (36.9 C) 98.8 F (37.1 C)  TempSrc: Oral Oral  Resp: 20 18  SpO2: 100% 100%   I personally performed the services described in this documentation, which was scribed in my presence. The recorded information has been reviewed and is accurate.  Plain film of right forearm negative for acute osseous injuries or radiopaque findings. Plain film of right hand negative for acute osseous injury or foreign body. Urine pregnancy negative. Patient seen and assessed by attending physician, Dr. Lars MageI. Knapp - does not believe to be tendon, deep tendon or muscle involvement - patient has full ROM and sensation intact. Close wound and outpatient follow-up with hand.  Wound thoroughly irrigated with negative findings of glass or foreign bodies. 2 subcutaneous sutures placed with 22 superficial, single interrupted sutures placed. Patient tolerated procedure well. Tetanus given in ED setting. Wound thoroughly cleaned as well as gauze placed. Patient stable, afebrile. Negative focal neurological deficits noted. Strength intact MCP, PIP, DIP joints. Pulses palpable and strong-radial 2+ bilaterally. Cap refill less than 3 seconds. Sensation intact. Negative findings of open fracture. Laceration repaired. Discussed with patient that sutures need to be removed within 5-7 days. Patient stable, afebrile. Patient not septic appearing. Discharged patient. Referred patient to hand specialist. Referred patient to primary care provider. Discussed with patient proper wound care. Discussed with patient to closely monitor symptoms and if symptoms are to worsen or change to report back to the ED - strict return instructions given.  Patient agreed to plan of care, understood, all  questions answered.   Raymon MuttonMarissa Sciacca, PA-C 07/27/13 0348  Raymon MuttonMarissa Sciacca, PA-C 07/27/13 0350  Raymon MuttonMarissa Sciacca, PA-C 07/27/13 1305

## 2013-07-26 NOTE — ED Notes (Signed)
PT ambulated with baseline gait; VSS; A&Ox3; no signs of distress; respirations even and unlabored; skin warm and dry; no questions upon discharge.  

## 2013-07-26 NOTE — ED Notes (Signed)
Family at bedside. 

## 2013-07-26 NOTE — ED Notes (Signed)
MD at bedside. 

## 2013-07-26 NOTE — ED Notes (Signed)
Suture cart at bedside 

## 2013-07-26 NOTE — ED Provider Notes (Signed)
Pt tripped and fell onto a glass coffee table tonight and lacerated her right forearm.She has good ROM of her fingers and intact sensation. She has 3 linear lacerations into the subcutaneous fat as shown.       Medical screening examination/treatment/procedure(s) were conducted as a shared visit with non-physician practitioner(s) and myself.  I personally evaluated the patient during the encounter.   EKG Interpretation None       Devoria AlbeIva Knapp, MD, Armando GangFACEP   Ward GivensIva L Knapp, MD 07/26/13 2111

## 2013-07-26 NOTE — ED Notes (Signed)
Went to assess pt, pt on phone and states to this RN that she needs to leave to get her children, she will come back. THis RN informed pt that she needs to be evaluated based on severity of her wounds, that the best decision would be to stay and be evaluated. Pt states that she will be leaving, pt tearful. Marissa, PA notified and at bedside.

## 2013-07-26 NOTE — ED Notes (Signed)
PA at bedside.

## 2013-07-26 NOTE — Discharge Instructions (Signed)
Please call your doctor for a followup appointment within 24-48 hours. When you talk to your doctor please let them know that you were seen in the emergency department and have them acquire all of your records so that they can discuss the findings with you and formulate a treatment plan to fully care for your new and ongoing problems. Please call and set up an appointment with Dr. Izora Ribasoley hand specialist Please rest and stay hydrated Please take pain medications as prescribed- while on pain medications there is to be no drinking alcohol, driving, operating any heavy machinery. Please do not take any extra Tylenol for this can lead to Tylenol overdose and liver issues. Please keep wounds clean-please wash with warm water and soap at least 3-4 times per day. Please apply antibiotic ointment and keep covered. Sutures needed be removed within approximately 7 days. Please continue to monitor symptoms closely if symptoms are to worsen or change (fever greater than 101, chills, chest pain, shortness of breath, difficulty breathing, numbness, tingling, fall, injury, bleeding, pus drainage, numbness, tingling, loss of sensation, swelling, redness, hot to the touch, red streaks) please report back to the ED immediately  Laceration Care, Adult A laceration is a cut or lesion that goes through all layers of the skin and into the tissue just beneath the skin. TREATMENT  Some lacerations may not require closure. Some lacerations may not be able to be closed due to an increased risk of infection. It is important to see your caregiver as soon as possible after an injury to minimize the risk of infection and maximize the opportunity for successful closure. If closure is appropriate, pain medicines may be given, if needed. The wound will be cleaned to help prevent infection. Your caregiver will use stitches (sutures), staples, wound glue (adhesive), or skin adhesive strips to repair the laceration. These tools bring the  skin edges together to allow for faster healing and a better cosmetic outcome. However, all wounds will heal with a scar. Once the wound has healed, scarring can be minimized by covering the wound with sunscreen during the day for 1 full year. HOME CARE INSTRUCTIONS  For sutures or staples:  Keep the wound clean and dry.  If you were given a bandage (dressing), you should change it at least once a day. Also, change the dressing if it becomes wet or dirty, or as directed by your caregiver.  Wash the wound with soap and water 2 times a day. Rinse the wound off with water to remove all soap. Pat the wound dry with a clean towel.  After cleaning, apply a thin layer of the antibiotic ointment as recommended by your caregiver. This will help prevent infection and keep the dressing from sticking.  You may shower as usual after the first 24 hours. Do not soak the wound in water until the sutures are removed.  Only take over-the-counter or prescription medicines for pain, discomfort, or fever as directed by your caregiver.  Get your sutures or staples removed as directed by your caregiver. For skin adhesive strips:  Keep the wound clean and dry.  Do not get the skin adhesive strips wet. You may bathe carefully, using caution to keep the wound dry.  If the wound gets wet, pat it dry with a clean towel.  Skin adhesive strips will fall off on their own. You may trim the strips as the wound heals. Do not remove skin adhesive strips that are still stuck to the wound. They will fall off  in time. For wound adhesive:  You may briefly wet your wound in the shower or bath. Do not soak or scrub the wound. Do not swim. Avoid periods of heavy perspiration until the skin adhesive has fallen off on its own. After showering or bathing, gently pat the wound dry with a clean towel.  Do not apply liquid medicine, cream medicine, or ointment medicine to your wound while the skin adhesive is in place. This may  loosen the film before your wound is healed.  If a dressing is placed over the wound, be careful not to apply tape directly over the skin adhesive. This may cause the adhesive to be pulled off before the wound is healed.  Avoid prolonged exposure to sunlight or tanning lamps while the skin adhesive is in place. Exposure to ultraviolet light in the first year will darken the scar.  The skin adhesive will usually remain in place for 5 to 10 days, then naturally fall off the skin. Do not pick at the adhesive film. You may need a tetanus shot if:  You cannot remember when you had your last tetanus shot.  You have never had a tetanus shot. If you get a tetanus shot, your arm may swell, get red, and feel warm to the touch. This is common and not a problem. If you need a tetanus shot and you choose not to have one, there is a rare chance of getting tetanus. Sickness from tetanus can be serious. SEEK MEDICAL CARE IF:   You have redness, swelling, or increasing pain in the wound.  You see a red line that goes away from the wound.  You have yellowish-white fluid (pus) coming from the wound.  You have a fever.  You notice a bad smell coming from the wound or dressing.  Your wound breaks open before or after sutures have been removed.  You notice something coming out of the wound such as wood or glass.  Your wound is on your hand or foot and you cannot move a finger or toe. SEEK IMMEDIATE MEDICAL CARE IF:   Your pain is not controlled with prescribed medicine.  You have severe swelling around the wound causing pain and numbness or a change in color in your arm, hand, leg, or foot.  Your wound splits open and starts bleeding.  You have worsening numbness, weakness, or loss of function of any joint around or beyond the wound.  You develop painful lumps near the wound or on the skin anywhere on your body. MAKE SURE YOU:   Understand these instructions.  Will watch your  condition.  Will get help right away if you are not doing well or get worse. Document Released: 01/19/2005 Document Revised: 04/13/2011 Document Reviewed: 07/15/2010 [facility] Patient Information 2015 [place], [place]. This information is not intended to replace advice given to you by your health care provider. Make sure you discuss any questions you have with your health care provider.

## 2013-07-26 NOTE — ED Notes (Signed)
Pt reports she tripped on a toy and fell through a glass coffee table. PT has lacerations to right forearm and bilateral hands. Bleeding controlled. Pt states she bumped her head "a little" but denies HA. Denies LOC. NAD.

## 2013-07-28 NOTE — ED Provider Notes (Signed)
See prior note   Iva L Knapp, MD 07/28/13 0658 

## 2013-08-05 ENCOUNTER — Emergency Department (HOSPITAL_COMMUNITY)
Admission: EM | Admit: 2013-08-05 | Discharge: 2013-08-05 | Disposition: A | Discharge status: Home / Self Care | Payer: Medicaid Other | Attending: Emergency Medicine | Admitting: Emergency Medicine

## 2013-08-05 ENCOUNTER — Encounter (HOSPITAL_COMMUNITY): Payer: Self-pay | Admitting: Emergency Medicine

## 2013-08-05 ENCOUNTER — Emergency Department (HOSPITAL_COMMUNITY): Payer: Medicaid Other

## 2013-08-05 DIAGNOSIS — Z91013 Allergy to seafood: Secondary | ICD-10-CM | POA: Insufficient documentation

## 2013-08-05 DIAGNOSIS — Z4802 Encounter for removal of sutures: Secondary | ICD-10-CM | POA: Diagnosis not present

## 2013-08-05 DIAGNOSIS — R229 Localized swelling, mass and lump, unspecified: Secondary | ICD-10-CM

## 2013-08-05 DIAGNOSIS — M79609 Pain in unspecified limb: Secondary | ICD-10-CM | POA: Diagnosis not present

## 2013-08-05 DIAGNOSIS — M7989 Other specified soft tissue disorders: Secondary | ICD-10-CM | POA: Diagnosis present

## 2013-08-05 DIAGNOSIS — Z5189 Encounter for other specified aftercare: Secondary | ICD-10-CM

## 2013-08-05 DIAGNOSIS — F17200 Nicotine dependence, unspecified, uncomplicated: Secondary | ICD-10-CM | POA: Diagnosis not present

## 2013-08-05 MED ORDER — BACITRACIN ZINC 500 UNIT/GM EX OINT
TOPICAL_OINTMENT | Freq: Two times a day (BID) | CUTANEOUS | Status: DC
  Administered 2013-08-05: 1 via TOPICAL

## 2013-08-05 MED ORDER — CEPHALEXIN 250 MG PO CAPS
500.0000 mg | ORAL_CAPSULE | Freq: Once | ORAL | Status: AC
  Administered 2013-08-05: 500 mg via ORAL
  Filled 2013-08-05: qty 2

## 2013-08-05 MED ORDER — CEPHALEXIN 500 MG PO CAPS: 500.0000 mg | ORAL_CAPSULE | Freq: Four times a day (QID) | ORAL | Status: DC

## 2013-08-05 NOTE — Discharge Instructions (Signed)
Please call your doctor for a followup appointment within 24-48 hours. When you talk to your doctor please let them know that you were seen in the emergency department and have them acquire all of your records so that they can discuss the findings with you and formulate a treatment plan to fully care for your new and ongoing problems. Please report back to the ED tomorrow to be re-assessed!!! Please rest and stay hydrated  Please wash site at least 3-4 times per day and apply antibiotic ointment  Please take antibiotics as prescribed Please continue to monitor symptoms closely and if symptoms are to worsen or change (fever greater than 101, chills, sweating, nausea, vomiting, chest pain, shortness of breath, difficulty breathing, numbness, tingling, loss of sensation, redness, red streaks, drainage from the site of lacerations) please report back to the ED immediately   Wound Care Wound care helps prevent pain and infection.  You may need a tetanus shot if:  You cannot remember when you had your last tetanus shot.  You have never had a tetanus shot.  The injury broke your skin. If you need a tetanus shot and you choose not to have one, you may get tetanus. Sickness from tetanus can be serious. HOME CARE   Only take medicine as told by your doctor.  Clean the wound daily with mild soap and water.  Change any bandages (dressings) as told by your doctor.  Put medicated cream and a bandage on the wound as told by your doctor.  Change the bandage if it gets wet, dirty, or starts to smell.  Take showers. Do not take baths, swim, or do anything that puts your wound under water.  Rest and raise (elevate) the wound until the pain and puffiness (swelling) are better.  Keep all doctor visits as told. GET HELP RIGHT AWAY IF:   Yellowish-white fluid (pus) comes from the wound.  Medicine does not lessen your pain.  There is a red streak going away from the wound.  You have a fever. MAKE  SURE YOU:   Understand these instructions.  Will watch your condition.  Will get help right away if you are not doing well or get worse. Document Released: 10/29/2007 Document Revised: 04/13/2011 Document Reviewed: 05/25/2010 [facility] CenterExitCare Patient Information 2015 [place], [place]. This information is not intended to replace advice given to you by your health care provider. Make sure you discuss any questions you have with your health care provider.

## 2013-08-05 NOTE — ED Notes (Signed)
PA at bedside removing sutures.

## 2013-08-05 NOTE — ED Notes (Signed)
The pt has sutures in her rt forearm for 9 days shes here to get them removed.  She is c/o pain  With some sutures no longer in the suture line and the incision is sl open
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
## 2013-08-05 NOTE — ED Notes (Signed)
Pt verbalizes understanding of d/c instructions and denies any further needs at this time. 

## 2013-08-06 NOTE — ED Provider Notes (Signed)
Medical screening examination/treatment/procedure(s) were conducted as a shared visit with non-physician practitioner(s) and myself.  I personally evaluated the patient during the encounter.   EKG Interpretation None     Patient's forearm examined and no evidence of compartment syndrome. No purulent drainage noted. Slightly warm to the touch. Suspect cellulitis and we'll treat appropriate  Toy BakerAnthony T Eliyah Bazzi, MD 08/06/13 [phone]

## 2013-08-20 ENCOUNTER — Emergency Department (HOSPITAL_BASED_OUTPATIENT_CLINIC_OR_DEPARTMENT_OTHER): Payer: Medicaid Other

## 2013-08-20 ENCOUNTER — Emergency Department (HOSPITAL_BASED_OUTPATIENT_CLINIC_OR_DEPARTMENT_OTHER)
Admission: EM | Admit: 2013-08-20 | Discharge: 2013-08-20 | Disposition: A | Discharge status: Home / Self Care | Payer: Medicaid Other | Attending: Emergency Medicine | Admitting: Emergency Medicine

## 2013-08-20 ENCOUNTER — Encounter (HOSPITAL_BASED_OUTPATIENT_CLINIC_OR_DEPARTMENT_OTHER): Payer: Self-pay | Admitting: Emergency Medicine

## 2013-08-20 DIAGNOSIS — Z8619 Personal history of other infectious and parasitic diseases: Secondary | ICD-10-CM | POA: Insufficient documentation

## 2013-08-20 DIAGNOSIS — R000 Tachycardia, unspecified: Secondary | ICD-10-CM | POA: Diagnosis not present

## 2013-08-20 DIAGNOSIS — R109 Unspecified abdominal pain: Secondary | ICD-10-CM | POA: Diagnosis present

## 2013-08-20 DIAGNOSIS — F17200 Nicotine dependence, unspecified, uncomplicated: Secondary | ICD-10-CM | POA: Insufficient documentation

## 2013-08-20 DIAGNOSIS — N12 Tubulo-interstitial nephritis, not specified as acute or chronic: Secondary | ICD-10-CM | POA: Diagnosis not present

## 2013-08-20 DIAGNOSIS — J45909 Unspecified asthma, uncomplicated: Secondary | ICD-10-CM | POA: Diagnosis not present

## 2013-08-20 DIAGNOSIS — Z8679 Personal history of other diseases of the circulatory system: Secondary | ICD-10-CM | POA: Diagnosis not present

## 2013-08-20 DIAGNOSIS — Z3202 Encounter for pregnancy test, result negative: Secondary | ICD-10-CM | POA: Insufficient documentation

## 2013-08-20 DIAGNOSIS — Z8742 Personal history of other diseases of the female genital tract: Secondary | ICD-10-CM | POA: Insufficient documentation

## 2013-08-20 LAB — BASIC METABOLIC PANEL
ANION GAP: 13 (ref 5–15)
BUN: 10 mg/dL (ref 6–23)
CALCIUM: 9.3 mg/dL (ref 8.4–10.5)
CHLORIDE: 101 meq/L (ref 96–112)
CO2: 25 mEq/L (ref 19–32)
Creatinine, Ser: 0.7 mg/dL (ref 0.50–1.10)
GFR calc non Af Amer: >90 mL/min (ref >90)
Glucose, Bld: 71 mg/dL (ref 70–99)
Potassium: 3.7 mEq/L (ref 3.7–5.3)
SODIUM: 139 meq/L (ref 137–147)

## 2013-08-20 LAB — URINALYSIS, ROUTINE W REFLEX MICROSCOPIC
Bilirubin Urine: NEGATIVE
GLUCOSE, UA: NEGATIVE mg/dL
KETONES UR: NEGATIVE mg/dL
Nitrite: POSITIVE — AB
PROTEIN: 30 mg/dL — AB
Specific Gravity, Urine: 1.025 (ref 1.005–1.030)
Urobilinogen, UA: 1 mg/dL (ref 0.0–1.0)
pH: 6 (ref 5.0–8.0)

## 2013-08-20 LAB — URINE MICROSCOPIC-ADD ON

## 2013-08-20 LAB — CBC WITH DIFFERENTIAL/PLATELET
BASOS ABS: 0 10*3/uL (ref 0.0–0.1)
Basophils Relative: 0 % (ref 0–1)
EOS PCT: 3 % (ref 0–5)
Eosinophils Absolute: 0.3 10*3/uL (ref 0.0–0.7)
HCT: 35.8 % — ABNORMAL LOW (ref 36.0–46.0)
Hemoglobin: 11.9 g/dL — ABNORMAL LOW (ref 12.0–15.0)
Lymphocytes Relative: 28 % (ref 12–46)
Lymphs Abs: 2.8 10*3/uL (ref 0.7–4.0)
MCH: 31.9 pg (ref 26.0–34.0)
MCHC: 33.2 g/dL (ref 30.0–36.0)
MCV: 96 fL (ref 78.0–100.0)
Monocytes Absolute: 1.4 10*3/uL — ABNORMAL HIGH (ref 0.1–1.0)
Monocytes Relative: 14 % — ABNORMAL HIGH (ref 3–12)
NEUTROS ABS: 5.5 10*3/uL (ref 1.7–7.7)
Neutrophils Relative %: 55 % (ref 43–77)
PLATELETS: 237 10*3/uL (ref 150–400)
RBC: 3.73 MIL/uL — ABNORMAL LOW (ref 3.87–5.11)
RDW: 13.9 % (ref 11.5–15.5)
WBC: 10 10*3/uL (ref 4.0–10.5)

## 2013-08-20 LAB — PREGNANCY, URINE: PREG TEST UR: NEGATIVE

## 2013-08-20 LAB — I-STAT CG4 LACTIC ACID, ED: LACTIC ACID, VENOUS: 0.93 mmol/L (ref 0.5–2.2)

## 2013-08-20 MED ORDER — DEXTROSE 5 % IV SOLN
1.0000 g | Freq: Once | INTRAVENOUS | Status: AC
  Administered 2013-08-20: 1 g via INTRAVENOUS

## 2013-08-20 MED ORDER — CEFTRIAXONE SODIUM 1 G IJ SOLR
INTRAMUSCULAR | Status: AC
  Filled 2013-08-20: qty 10

## 2013-08-20 MED ORDER — HYDROCODONE-ACETAMINOPHEN 5-325 MG PO TABS
1.0000 | ORAL_TABLET | Freq: Once | ORAL | Status: AC
  Administered 2013-08-20: 1 via ORAL
  Filled 2013-08-20: qty 1

## 2013-08-20 MED ORDER — HYDROCODONE-ACETAMINOPHEN 5-325 MG PO TABS: ORAL_TABLET | ORAL | Status: DC

## 2013-08-20 MED ORDER — CEFTRIAXONE SODIUM 1 G IJ SOLR: 1.0000 g | Freq: Once | INTRAMUSCULAR | Status: DC

## 2013-08-20 MED ORDER — CEPHALEXIN 500 MG PO CAPS: 500.0000 mg | ORAL_CAPSULE | Freq: Two times a day (BID) | ORAL | Status: DC

## 2013-08-20 MED ORDER — SODIUM CHLORIDE 0.9 % IV BOLUS (SEPSIS)
1000.0000 mL | Freq: Once | INTRAVENOUS | Status: AC
  Administered 2013-08-20: 1000 mL via INTRAVENOUS

## 2013-08-20 NOTE — ED Notes (Signed)
Pt discharged to home with friends. NAD.

## 2013-08-20 NOTE — Discharge Instructions (Signed)
°  Take your antibiotics as directed and to completion. You should never have any leftover antibiotics! Push fluids and stay well hydrated.  Take vicodin for breakthrough pain, do not drink alcohol, drive, care for children or do other critical tasks while taking vicodin.  Please follow with your primary care doctor in the next 2 days for a check-up. They must obtain records for further management.   Do not hesitate to return to the Emergency Department for any new, worsening or concerning symptoms.   Pyelonephritis, Adult Pyelonephritis is a kidney infection. A kidney infection can happen quickly, or it can last for a long time. HOME CARE   Take your medicine (antibiotics) as told. Finish it even if you start to feel better.  Keep all doctor visits as told.  Drink enough fluids to keep your pee (urine) clear or pale yellow.  Only take medicine as told by your doctor. GET HELP RIGHT AWAY IF:   You have a fever or lasting symptoms for more than 2-3 days.  You have a fever and your symptoms suddenly get worse.  You cannot take your medicine or drink fluids as told.  You have chills and shaking.  You feel very weak or pass out (faint).  You do not feel better after 2 days. MAKE SURE YOU:  Understand these instructions.  Will watch your condition.  Will get help right away if you are not doing well or get worse. Document Released: 02/27/2004 Document Revised: 07/21/2011 Document Reviewed: 07/09/2010 [facility] Patient Information 2015 [employer] [place]. This information is not intended to replace advice given to you by your health care provider. Make sure you discuss any questions you have with your health care provider.

## 2013-08-20 NOTE — ED Provider Notes (Signed)
CSN: [id number]     Arrival date & time 08/20/13  1748 History   First MD Initiated Contact with Patient 08/20/13 1841     Chief Complaint  Patient presents with  . Flank Pain     (Consider location/radiation/quality/duration/timing/severity/associated sxs/prior Treatment) HPI  Julia Cain is a 22 y.o. female complaining of right flank pain worsening over the course of 4 days. Associated symptoms of urinary frequency, chills and nausea. Patient denies fever, dysuria, hematuria, vomiting, change in bowel habits, abdominal pain, history of kidney stones. States that she has been taking Motrin and smoking marijuana for pain control with little relief. Patient is trying to get pregnant, she's been trying for 4 months.  Past Medical History  Diagnosis Date  . No pertinent past medical history   . PIH (pregnancy induced hypertension)   . Asthma   . Pregnancy induced hypertension   . Trichomonas   . Miscarriage within last 12 months    Past Surgical History  Procedure Laterality Date  . Dilation and curettage of uterus    . Induced abortion     Family History  Problem Relation Age of Onset  . Hypertension Mother   . Cancer Paternal Uncle   . Asthma Sister    History  Substance Use Topics  . Smoking status: Current Every Day Smoker -- 3.00 packs/day    Types: Cigarettes  . Smokeless tobacco: Never Used  . Alcohol Use: Yes   OB History   Grav Para Term Preterm Abortions TAB SAB Ect Mult Living   5 1 1  2 1 1  1 2      Review of Systems  10 systems reviewed and found to be negative, except as noted in the HPI.   Allergies  Shellfish allergy  Home Medications   Prior to Admission medications   Medication Sig Start Date End Date Taking? Authorizing Provider  cephALEXin (KEFLEX) 500 MG capsule Take 1 capsule (500 mg total) by mouth 4 (four) times daily. 08/05/13   Julia Sciacca, PA-C  cephALEXin (KEFLEX) 500 MG capsule Take 1 capsule (500 mg total) by mouth 2 (two)  times daily. 08/20/13   Julia Hobin, PA-C  HYDROcodone-acetaminophen (NORCO/VICODIN) 5-325 MG per tablet Take 1 tablet by mouth every 6 (six) hours as needed for moderate pain or severe pain. 07/26/13   Julia Sciacca, PA-C  HYDROcodone-acetaminophen (NORCO/VICODIN) 5-325 MG per tablet Take 1-2 tablets by mouth every 6 hours as needed for pain. 08/20/13   Julia Cuppett, PA-C   BP 109/52  Pulse 71  Temp(Src) 99 F (37.2 C) (Oral)  Resp 18  Ht 5\' 2"  (1.575 m)  Wt 110 lb (49.896 kg)  BMI 20.11 kg/m2  SpO2 100%  LMP 07/17/2013 Physical Exam  Nursing note and vitals reviewed. Constitutional: She is oriented to person, place, and time. She appears well-developed and well-nourished. No distress.  HENT:  Head: Normocephalic and atraumatic.  Right Ear: External ear normal.  Eyes: Conjunctivae and EOM are normal. Pupils are equal, round, and reactive to light.  Neck: Normal range of motion.  Cardiovascular: Regular rhythm and intact distal pulses.   Tachycardic  Pulmonary/Chest: Effort normal and breath sounds normal. No stridor. No respiratory distress. She has no wheezes. She has no rales. She exhibits no tenderness.  Abdominal: Soft. Bowel sounds are normal. She exhibits no distension and no mass. There is no tenderness. There is no rebound and no guarding.  Genitourinary:  Exquisite right CVA tenderness to palpation.  Musculoskeletal: Normal range of  motion. She exhibits no edema.  Neurological: She is alert and oriented to person, place, and time.  Psychiatric: She has a normal mood and affect.    ED Course  Procedures (including critical care time) Labs Review Labs Reviewed  URINALYSIS, ROUTINE W REFLEX MICROSCOPIC - Abnormal; Notable for the following:    Color, Urine AMBER (*)    APPearance CLOUDY (*)    Hgb urine dipstick LARGE (*)    Protein, ur 30 (*)    Nitrite POSITIVE (*)    Leukocytes, UA MODERATE (*)    All other components within normal limits  URINE  MICROSCOPIC-ADD ON - Abnormal; Notable for the following:    Squamous Epithelial / LPF FEW (*)    Bacteria, UA MANY (*)    All other components within normal limits  CBC WITH DIFFERENTIAL - Abnormal; Notable for the following:    RBC 3.73 (*)    Hemoglobin 11.9 (*)    HCT 35.8 (*)    Monocytes Relative 14 (*)    Monocytes Absolute 1.4 (*)    All other components within normal limits  URINE CULTURE  PREGNANCY, URINE  BASIC METABOLIC PANEL  I-STAT CG4 LACTIC ACID, ED    Imaging Review [place] Renal  08/20/2013   CLINICAL DATA:  Left flank pain and nausea.  EXAM: RENAL/URINARY TRACT ULTRASOUND COMPLETE  COMPARISON:  CT abdomen pelvis 02/26/2013.  FINDINGS: Right Kidney:  Length: 12.1 cm. Parenchymal echogenicity is within normal limits. No hydronephrosis. No focal lesion.  Left Kidney:  Length: 11.6 cm. Parenchymal echogenicity is within normal limits. No hydronephrosis. No focal lesion.  Bladder:  Suboptimally distended, limiting evaluation.  IMPRESSION: No findings to explain the patient's left flank pain.   Electronically Signed   By: Leanna BattlesMelinda  Blietz M.D.   On: 08/20/2013 19:36     EKG Interpretation None      MDM   Final diagnoses:  Pyelonephritis   Filed Vitals:   08/20/13 1754 08/20/13 2031  BP: 90/50 109/52  Pulse: 110 71  Temp: 99 F (37.2 C) 99 F (37.2 C)  TempSrc: Oral Oral  Resp: 17 18  Height: 5\' 2"  (1.575 m)   Weight: 110 lb (49.896 kg)   SpO2: 100% 100%    Medications  cefTRIAXone (ROCEPHIN) 1 G injection (not administered)  HYDROcodone-acetaminophen (NORCO/VICODIN) 5-325 MG per tablet 1 tablet (1 tablet Oral Given 08/20/13 1933)  sodium chloride 0.9 % bolus 1,000 mL (1,000 mLs Intravenous New Bag/Given 08/20/13 1934)  cefTRIAXone (ROCEPHIN) 1 g in dextrose 5 % 50 mL IVPB (1 g Intravenous New Bag/Given 08/20/13 1934)    Julia Cain is a [age] y.o. female presenting with right flank pain, chills, nausea worsening over the course of 4 days. No history of  kidney stones. Patient is tachycardic and blood pressure is soft (note that patient is very slim) patient afebrile, but will check lactate. urinalysis consistent with infection. Will obtain ultrasound to evaluate for hydronephrosis. Patient will be given bolus, blood work pending, she will be given Vicodin, urine culture pending.  Patient with normal white count, normal active acid, renal ultrasound shows no hydronephrosis. Chemistry with no abnormalities. Tachycardia and soft blood pressure has improved after bolus to medications.  Evaluation does not show pathology that would require ongoing emergent intervention or inpatient treatment. Pt is hemodynamically stable and mentating appropriately. Discussed findings and plan with patient/guardian, who agrees with care plan. All questions answered. Return precautions discussed and outpatient follow up given.   New Prescriptions  CEPHALEXIN (KEFLEX) 500 MG CAPSULE    Take 1 capsule (500 mg total) by mouth 2 (two) times daily.   HYDROCODONE-ACETAMINOPHEN (NORCO/VICODIN) 5-325 MG PER TABLET    Take 1-2 tablets by mouth every 6 hours as needed for pain.         Wynetta Emery, PA-C 08/20/13 2035

## 2013-08-20 NOTE — ED Notes (Signed)
Pt reports left flank pain x4 days with Nausea, denies hematuria.

## 2013-08-21 NOTE — ED Provider Notes (Signed)
Medical screening examination/treatment/procedure(s) were performed by non-physician practitioner and as supervising physician I was immediately available for consultation/collaboration.   EKG Interpretation None       Julia HornJohn M Miara Emminger, MD 08/21/13 2130

## 2013-08-23 LAB — URINE CULTURE: Colony Count: >=100000

## 2013-08-24 ENCOUNTER — Telehealth (HOSPITAL_BASED_OUTPATIENT_CLINIC_OR_DEPARTMENT_OTHER): Payer: Self-pay | Admitting: Emergency Medicine

## 2013-08-24 NOTE — Telephone Encounter (Signed)
Post ED Visit - Positive Culture Follow-up  Culture report reviewed by antimicrobial stewardship pharmacist: []  Wes Dulaney, Pharm.D., BCPS []  Celedonio MiyamotoJeremy Frens, Pharm.D., BCPS []  Georgina PillionElizabeth Martin, Pharm.D., BCPS [x]  West LibertyMinh Pham, 17[address] Rainbow BoulevardPharm.D., BCPS, AAHIVP []  Estella HuskMichelle Turner, Pharm.D., BCPS, AAHIVP  Positive urine culture Treated with Keflex, organism sensitive to the same and no further patient follow-up is required at this time.  Zeb ComfortHolland, Andra Heslin 08/24/2013, 6:47 PM

## 2013-08-30 ENCOUNTER — Encounter (HOSPITAL_COMMUNITY): Payer: Self-pay | Admitting: Advanced Practice Midwife

## 2013-08-30 ENCOUNTER — Encounter (HOSPITAL_COMMUNITY): Payer: Self-pay | Admitting: *Deleted

## 2013-08-30 ENCOUNTER — Inpatient Hospital Stay (EMERGENCY_DEPARTMENT_HOSPITAL)
Admission: AD | Admit: 2013-08-30 | Discharge: 2013-08-30 | Discharge status: Against Medical Advice | Payer: Medicaid Other | Source: Ambulatory Visit | Attending: Obstetrics | Admitting: Obstetrics

## 2013-08-30 ENCOUNTER — Inpatient Hospital Stay (HOSPITAL_COMMUNITY)
Admission: AD | Admit: 2013-08-30 | Discharge: 2013-08-30 | Disposition: A | Discharge status: Home / Self Care | Payer: Medicaid Other | Source: Ambulatory Visit | Attending: Obstetrics | Admitting: Obstetrics

## 2013-08-30 ENCOUNTER — Inpatient Hospital Stay (HOSPITAL_COMMUNITY): Payer: Medicaid Other

## 2013-08-30 DIAGNOSIS — O30009 Twin pregnancy, unspecified number of placenta and unspecified number of amniotic sacs, unspecified trimester: Secondary | ICD-10-CM | POA: Diagnosis not present

## 2013-08-30 DIAGNOSIS — O99891 Other specified diseases and conditions complicating pregnancy: Secondary | ICD-10-CM | POA: Insufficient documentation

## 2013-08-30 DIAGNOSIS — O9989 Other specified diseases and conditions complicating pregnancy, childbirth and the puerperium: Secondary | ICD-10-CM

## 2013-08-30 DIAGNOSIS — O26899 Other specified pregnancy related conditions, unspecified trimester: Secondary | ICD-10-CM

## 2013-08-30 DIAGNOSIS — O99330 Smoking (tobacco) complicating pregnancy, unspecified trimester: Secondary | ICD-10-CM | POA: Insufficient documentation

## 2013-08-30 DIAGNOSIS — R1032 Left lower quadrant pain: Secondary | ICD-10-CM | POA: Diagnosis present

## 2013-08-30 DIAGNOSIS — R109 Unspecified abdominal pain: Secondary | ICD-10-CM

## 2013-08-30 LAB — URINALYSIS, ROUTINE W REFLEX MICROSCOPIC
Bilirubin Urine: NEGATIVE
Glucose, UA: NEGATIVE mg/dL
KETONES UR: NEGATIVE mg/dL
Leukocytes, UA: NEGATIVE
Nitrite: NEGATIVE
PROTEIN: NEGATIVE mg/dL
Specific Gravity, Urine: 1.02 (ref 1.005–1.030)
Urobilinogen, UA: 1 mg/dL (ref 0.0–1.0)
pH: 6.5 (ref 5.0–8.0)

## 2013-08-30 LAB — RAPID URINE DRUG SCREEN, HOSP PERFORMED
Amphetamines: NOT DETECTED
BARBITURATES: NOT DETECTED
BENZODIAZEPINES: NOT DETECTED
Cocaine: NOT DETECTED
Opiates: NOT DETECTED
TETRAHYDROCANNABINOL: POSITIVE — AB

## 2013-08-30 LAB — WET PREP, GENITAL
Trich, Wet Prep: NONE SEEN
Yeast Wet Prep HPF POC: NONE SEEN

## 2013-08-30 LAB — CBC
HCT: 33.8 % — ABNORMAL LOW (ref 36.0–46.0)
Hemoglobin: 11.2 g/dL — ABNORMAL LOW (ref 12.0–15.0)
MCH: 31.8 pg (ref 26.0–34.0)
MCHC: 33.1 g/dL (ref 30.0–36.0)
MCV: 96 fL (ref 78.0–100.0)
PLATELETS: 309 10*3/uL (ref 150–400)
RBC: 3.52 MIL/uL — ABNORMAL LOW (ref 3.87–5.11)
RDW: 14 % (ref 11.5–15.5)
WBC: 6.2 10*3/uL (ref 4.0–10.5)

## 2013-08-30 LAB — URINE MICROSCOPIC-ADD ON

## 2013-08-30 LAB — HCG, QUANTITATIVE, PREGNANCY: HCG, BETA CHAIN, QUANT, S: 3347 m[IU]/mL — AB (ref <5)

## 2013-08-30 LAB — POCT PREGNANCY, URINE: Preg Test, Ur: POSITIVE — AB

## 2013-08-30 NOTE — MAU Provider Note (Signed)
  History     CSN: [id number]  Arrival date and time: 08/30/13 1414   None     Chief Complaint  Patient presents with  . Abdominal Pain   HPI This is a 23 y.o. at early gestation who presents for confirmation of pregnancy.  Had + test at home then negative at hospital.. Now c/o LLQ pain but did not finish meds for UTI/pyelo.  Denies bleeding. Unsure dates.  RN Note:'  [facility] Pyel[facility]ph[facility] and treated with Rocephin and Keflex. [facility]. Pt continues to have left sided lower abd pain. No vaginal bleeding. No abnormal vaginal discharge. Pt states her periods have been very irregular and sometimes only for one or two days and can't recall when she had a regular cycle.       OB History   Grav Para Term Preterm Abortions TAB SAB Ect Mult Living   6 1 1  2 1 1  1 2       Past Medical History  Diagnosis Date  . No pertinent past medical history   . PIH (pregnancy induced hypertension)   . Asthma   . Pregnancy induced hypertension   . Trichomonas   . Miscarriage within last 12 months     Past Surgical History  Procedure Laterality Date  . Dilation and curettage of uterus    . Induced abortion      Family History  Problem Relation Age of Onset  . Hypertension Mother   . Cancer Paternal Uncle   . Asthma Sister     History  Substance Use Topics  . Smoking status: Current Every Day Smoker -- 1.50 packs/day    Types: Cigarettes  . Smokeless tobacco: Never Used  . Alcohol Use: Yes     Comment: 1-2 blunts a day, usually drinks a bottle of Brandy or some other kink of liquor a week.    Allergies:  Allergies  Allergen Reactions  . Shellfish Allergy Itching, Swelling and Rash    Prescriptions prior to admission  Medication Sig Dispense Refill  . cephALEXin (KEFLEX) 500 MG capsule Take 1 capsule (500 mg total) by mouth 4 (four) times daily.  40 capsule  0  . cephALEXin (KEFLEX) 500 MG capsule Take 1 capsule (500 mg  total) by mouth 2 (two) times daily.  20 capsule  0  . HYDROcodone-acetaminophen (NORCO/VICODIN) 5-325 MG per tablet Take 1 tablet by mouth every 6 (six) hours as needed for moderate pain or severe pain.  11 tablet  0  . HYDROcodone-acetaminophen (NORCO/VICODIN) 5-325 MG per tablet Take 1-2 tablets by mouth every 6 hours as needed for pain.  15 tablet  0    ROS Physical Exam   Blood pressure 123/86, pulse 79, temperature 98.4 F (36.9 C), temperature source Oral, resp. rate 12, height 5\' 2"  (1.575 m), weight 49.896 kg (110 lb), SpO2 100.00%.  Physical Exam  MAU Course  Procedures  MDM  Assessment and Plan  Discussed history with pt. Her pain started before pregnancy, Comes and goes Discussed it could be ectopic which can endanger her life if left untreated Pt needs to take her girls to their dad, wants to sign out AMA and come back after that and start over again.   Wynelle BourgeoisWILLIAMS,Trevone Prestwood 08/30/2013, 3:19 PM

## 2013-08-30 NOTE — MAU Note (Signed)
Returned to complete work up. Had to leave to take care of kids.

## 2013-08-30 NOTE — MAU Provider Note (Signed)

## 2013-08-30 NOTE — MAU Note (Addendum)
[facility] with Pyel[facility]ph[facility] and treated with Rocephin and Keflex.  [facility]  Pt continues to have left sided lower abd pain.  No vaginal bleeding.  No abnormal vaginal discharge.  Pt states her periods have been very irregular and sometimes only for one or two days and can't recall when she had a regular cycle.

## 2013-08-30 NOTE — Discharge Instructions (Signed)
Abdominal Pain During Pregnancy °Belly (abdominal) pain is common during pregnancy. Most of the time, it is not a serious problem. Other times, it can be a sign that something is wrong with the pregnancy. Always tell your doctor if you have belly pain. °HOME CARE °Monitor your belly pain for any changes. The following actions may help you feel better: °· Do not have sex (intercourse) or put anything in your vagina until you feel better. °· Rest until your pain stops. °· Drink clear fluids if you feel sick to your stomach (nauseous). Do not eat solid food until you feel better. °· Only take medicine as told by your doctor. °· Keep all doctor visits as told. °GET HELP RIGHT AWAY IF:  °· You are bleeding, leaking fluid, or pieces of tissue come out of your vagina. °· You have more pain or cramping. °· You keep throwing up (vomiting). °· You have pain when you pee (urinate) or have blood in your pee. °· You have a fever. °· You do not feel your baby moving as much. °· You feel very weak or feel like passing out. °· You have trouble breathing, with or without belly pain. °· You have a very bad headache and belly pain. °· You have fluid leaking from your vagina and belly pain. °· You keep having watery poop (diarrhea). °· Your belly pain does not go away after resting, or the pain gets worse. °MAKE SURE YOU:  °· Understand these instructions. °· Will watch your condition. °· Will get help right away if you are not doing well or get worse. °Document Released: 01/07/2009 Document Revised: 09/21/2012 Document Reviewed: 08/18/2012 °ExitCare® Patient Information ©2015 [employer]. This information is not intended to replace advice given to you by your health care provider. Make sure you discuss any questions you have with your health care provider. ° °

## 2013-08-30 NOTE — MAU Provider Note (Signed)
History     CSN: [id number]  Arrival date and time: 08/30/13 1715   None     No chief complaint on file.  HPI This is a 23 y.o. at early gestation who presents for confirmation of pregnancy. Had + test at home then negative at hospital.. Now c/o LLQ pain but did not finish meds for UTI/pyelo. Denies bleeding. Unsure dates. Was here a while ago and left AMA.  Is now back to complete evaluation.  RN Note:'   Pt was seen at [facility] on 08-20-13 with Pyelonephritis and treated with Rocephin and Keflex. Pt had Neg UPT at [facility] hospital. Pt continues to have left sided lower abd pain. No vaginal bleeding. No abnormal vaginal discharge. Pt states her periods have been very irregular and sometimes only for one or two days and can't recall when she had a regular cycle.     OB History   Grav Para Term Preterm Abortions TAB SAB Ect Mult Living   6 1 1  2 1 1  1 2       Past Medical History  Diagnosis Date  . No pertinent past medical history   . PIH (pregnancy induced hypertension)   . Asthma   . Pregnancy induced hypertension   . Trichomonas   . Miscarriage within last 12 months     Past Surgical History  Procedure Laterality Date  . Dilation and curettage of uterus    . Induced abortion      Family History  Problem Relation Age of Onset  . Hypertension Mother   . Cancer Paternal Uncle   . Asthma Sister     History  Substance Use Topics  . Smoking status: Current Every Day Smoker -- 1.50 packs/day    Types: Cigarettes  . Smokeless tobacco: Never Used  . Alcohol Use: Yes     Comment: 1-2 blunts a day, usually drinks a bottle of Brandy or some other kink of liquor a week.    Allergies:  Allergies  Allergen Reactions  . Shellfish Allergy Itching, Swelling and Rash    Prescriptions prior to admission  Medication Sig Dispense Refill  . cephALEXin (KEFLEX) 500 MG capsule Take 1 capsule (500 mg total) by mouth 4 (four) times daily.  40 capsule  0  . cephALEXin (KEFLEX) 500  MG capsule Take 1 capsule (500 mg total) by mouth 2 (two) times daily.  20 capsule  0  . HYDROcodone-acetaminophen (NORCO/VICODIN) 5-325 MG per tablet Take 1-2 tablets by mouth every 6 hours as needed for pain.  15 tablet  0    Review of Systems  Constitutional: Negative for fever, chills and malaise/fatigue.  Gastrointestinal: Positive for abdominal pain. Negative for nausea, vomiting, diarrhea and constipation.  Genitourinary: Negative for dysuria.  Neurological: Negative for dizziness and weakness.   Physical Exam   There were no vitals taken for this visit.  Physical Exam  Constitutional: She is oriented to person, place, and time. She appears well-developed and well-nourished. No distress.  HENT:  Head: Normocephalic.  Cardiovascular: Normal rate.   Respiratory: Effort normal.  GI: Soft. She exhibits no distension. There is no tenderness. There is no rebound and no guarding.  Genitourinary: Vagina normal.  Musculoskeletal: Normal range of motion.  Neurological: She is alert and oriented to person, place, and time.  Skin: Skin is warm and dry.  Psychiatric: She has a normal mood and affect.    MAU Course  Procedures  MDM Results for orders placed during the hospital encounter  of 08/30/13 (from the past 24 hour(s))  CBC     Status: Abnormal   Collection Time    08/30/13  5:30 PM      Result Value Ref Range   WBC 6.2  4.0 - 10.5 K/uL   RBC 3.52 (*) 3.87 - 5.11 MIL/uL   Hemoglobin 11.2 (*) 12.0 - 15.0 g/dL   HCT 16.133.8 (*) 09.636.0 - 04.546.0 %   MCV 96.0  78.0 - 100.0 fL   MCH 31.8  26.0 - 34.0 pg   MCHC 33.1  30.0 - 36.0 g/dL   RDW 40.914.0  81.111.5 - 91.415.5 %   Platelets 309  150 - 400 K/uL  HCG, QUANTITATIVE, PREGNANCY     Status: Abnormal   Collection Time    08/30/13  5:31 PM      Result Value Ref Range   hCG, Beta Chain, Quant, S 3347 (*) <5 mIU/mL  WET PREP, GENITAL     Status: Abnormal   Collection Time    08/30/13  5:50 PM      Result Value Ref Range   Yeast Wet Prep  HPF POC NONE SEEN  NONE SEEN   Trich, Wet Prep NONE SEEN  NONE SEEN   Clue Cells Wet Prep HPF POC FEW (*) NONE SEEN   WBC, Wet Prep HPF POC FEW (*) NONE SEEN   [place] Ob Transvaginal  08/30/2013   CLINICAL DATA:  Left lower quadrant pain.  EXAM: TWIN OBSTETRIC <14WK US AND TRANSVAGINAL OB US  COMPARISON:  None.    FINDINGS: TWIN 1  Intrauterine gestational sac: Visualized/normal in shape.  Yolk sac:  Not visualized.  Embryo:  Not visualized.  Cardiac Activity: Not visualized.  MSD: 3.2  mm   4 w   6  d  [facility]                      TWIN 2  Intrauterine gestational sac: Visualized/normal in shape.  Yolk sac:  Not visualized.  Embryo:  Not visualized.  Cardiac Activity: Not visualized.  MSD: 4.1  mm   4 w   6  d  [facility]                      Maternal uterus/adnexae: No hemorrhage is noted. No free fluid is noted.                       Probable corpus luteum seen in each ovary.    IMPRESSION: Probable early intrauterine gestational sacs consistent with twin pregnancy, but no yolk sac, fetal pole, or cardiac activity yet visualized.   Recommend follow-up quantitative B-HCG levels and follow-up US in 14 days to confirm and assess viability.   This recommendation follows SRU consensus guidelines: Diagnostic Criteria for Nonviable Pregnancy Early in the First Trimester. Malva Limes Engl J Med 2013; 782:9562-13; 369:1443-51.   Electronically Signed   By: Roque LiasJames  Green M.D.   On: 08/30/2013 19:08    Assessment and Plan  A:  Twin gestational sacs in uterus       Cannot rule out ectopic yet without yolk sacs       Hx 2 18-week losses  P:  Discussed findings       Return in 2 days for quant       Repeat US in 7-10 days   [facility] LLCWILLIAMS,Myonna Chisom 08/30/2013, 5:40 PM

## 2013-08-31 LAB — GC/CHLAMYDIA PROBE AMP
CT Probe RNA: NEGATIVE
GC PROBE AMP APTIMA: NEGATIVE

## 2013-09-04 LAB — OB RESULTS CONSOLE HIV ANTIBODY (ROUTINE TESTING): HIV: NONREACTIVE

## 2013-09-04 LAB — OB RESULTS CONSOLE ABO/RH: RH Type: POSITIVE

## 2013-09-04 LAB — OB RESULTS CONSOLE RUBELLA ANTIBODY, IGM: Rubella: IMMUNE

## 2013-09-04 LAB — OB RESULTS CONSOLE GC/CHLAMYDIA
CHLAMYDIA, DNA PROBE: NEGATIVE
Gonorrhea: NEGATIVE

## 2013-09-04 LAB — OB RESULTS CONSOLE ANTIBODY SCREEN: ANTIBODY SCREEN: NEGATIVE

## 2013-09-04 LAB — OB RESULTS CONSOLE HEPATITIS B SURFACE ANTIGEN: Hepatitis B Surface Ag: NEGATIVE

## 2013-09-04 LAB — OB RESULTS CONSOLE RPR: RPR: NONREACTIVE

## 2013-10-21 ENCOUNTER — Inpatient Hospital Stay (HOSPITAL_COMMUNITY): Payer: Medicaid Other

## 2013-10-21 ENCOUNTER — Encounter (HOSPITAL_COMMUNITY): Payer: Self-pay | Admitting: *Deleted

## 2013-10-21 ENCOUNTER — Inpatient Hospital Stay (HOSPITAL_COMMUNITY)
Admission: AD | Admit: 2013-10-21 | Discharge: 2013-10-21 | Disposition: A | Discharge status: Home / Self Care | Payer: Medicaid Other | Source: Ambulatory Visit | Attending: Obstetrics | Admitting: Obstetrics

## 2013-10-21 DIAGNOSIS — O30009 Twin pregnancy, unspecified number of placenta and unspecified number of amniotic sacs, unspecified trimester: Secondary | ICD-10-CM | POA: Insufficient documentation

## 2013-10-21 DIAGNOSIS — O2341 Unspecified infection of urinary tract in pregnancy, first trimester: Secondary | ICD-10-CM

## 2013-10-21 DIAGNOSIS — N760 Acute vaginitis: Secondary | ICD-10-CM | POA: Diagnosis not present

## 2013-10-21 DIAGNOSIS — O2390 Unspecified genitourinary tract infection in pregnancy, unspecified trimester: Secondary | ICD-10-CM | POA: Diagnosis not present

## 2013-10-21 DIAGNOSIS — N390 Urinary tract infection, site not specified: Secondary | ICD-10-CM | POA: Diagnosis not present

## 2013-10-21 DIAGNOSIS — O99330 Smoking (tobacco) complicating pregnancy, unspecified trimester: Secondary | ICD-10-CM | POA: Insufficient documentation

## 2013-10-21 DIAGNOSIS — B9689 Other specified bacterial agents as the cause of diseases classified elsewhere: Secondary | ICD-10-CM | POA: Diagnosis not present

## 2013-10-21 DIAGNOSIS — A499 Bacterial infection, unspecified: Secondary | ICD-10-CM | POA: Insufficient documentation

## 2013-10-21 DIAGNOSIS — N949 Unspecified condition associated with female genital organs and menstrual cycle: Secondary | ICD-10-CM | POA: Diagnosis present

## 2013-10-21 LAB — URINALYSIS, ROUTINE W REFLEX MICROSCOPIC
BILIRUBIN URINE: NEGATIVE
Glucose, UA: NEGATIVE mg/dL
Ketones, ur: 15 mg/dL — AB
Leukocytes, UA: NEGATIVE
Nitrite: POSITIVE — AB
Protein, ur: 30 mg/dL — AB
SPECIFIC GRAVITY, URINE: 1.025 (ref 1.005–1.030)
Urobilinogen, UA: 1 mg/dL (ref 0.0–1.0)
pH: 6.5 (ref 5.0–8.0)

## 2013-10-21 LAB — URINE MICROSCOPIC-ADD ON

## 2013-10-21 LAB — WET PREP, GENITAL
Trich, Wet Prep: NONE SEEN
Yeast Wet Prep HPF POC: NONE SEEN

## 2013-10-21 MED ORDER — ACETAMINOPHEN-CODEINE #3 300-30 MG PO TABS: 1.0000 | ORAL_TABLET | ORAL | Status: DC | PRN

## 2013-10-21 MED ORDER — METRONIDAZOLE 500 MG PO TABS: 500.0000 mg | ORAL_TABLET | Freq: Two times a day (BID) | ORAL | Status: DC

## 2013-10-21 MED ORDER — NITROFURANTOIN MONOHYD MACRO 100 MG PO CAPS: 100.0000 mg | ORAL_CAPSULE | Freq: Two times a day (BID) | ORAL | Status: DC
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
## 2013-10-21 NOTE — Discharge Instructions (Signed)
Pregnancy and Urinary Tract Infection °A urinary tract infection (UTI) is a bacterial infection of the urinary tract. Infection of the urinary tract can include the ureters, kidneys (pyelonephritis), bladder (cystitis), and urethra (urethritis). All pregnant women should be screened for bacteria in the urinary tract. Identifying and treating a UTI will decrease the risk of preterm labor and developing more serious infections in both the mother and baby. °CAUSES °Bacteria germs cause almost all UTIs.  °RISK FACTORS °Many factors can increase your chances of getting a UTI during pregnancy. These include: °· Having a short urethra. °· Poor toilet and hygiene habits. °· Sexual intercourse. °· Blockage of urine along the urinary tract. °· Problems with the pelvic muscles or nerves. °· Diabetes. °· Obesity. °· Bladder problems after having several children. °· Previous history of UTI. °SIGNS AND SYMPTOMS  °· Pain, burning, or a stinging feeling when urinating. °· Suddenly feeling the need to urinate right away (urgency). °· Loss of bladder control (urinary incontinence). °· Frequent urination, more than is common with pregnancy. °· Lower abdominal or back discomfort. °· Cloudy urine. °· Blood in the urine (hematuria). °· Fever.  °When the kidneys are infected, the symptoms may be: °· Back pain. °· Flank pain on the right side more so than the left. °· Fever. °· Chills. °· Nausea. °· Vomiting. °DIAGNOSIS  °A urinary tract infection is usually diagnosed through urine tests. Additional tests and procedures are sometimes done. These may include: °· Ultrasound exam of the kidneys, ureters, bladder, and urethra. °· Looking in the bladder with a lighted tube (cystoscopy). °TREATMENT °Typically, UTIs can be treated with antibiotic medicines.  °HOME CARE INSTRUCTIONS  °· Only take over-the-counter or prescription medicines as directed by your health care provider. If you were prescribed antibiotics, take them as directed. Finish  them even if you start to feel better. °· Drink enough fluids to keep your urine clear or pale yellow. °· Do not have sexual intercourse until the infection is gone and your health care provider says it is okay. °· Make sure you are tested for UTIs throughout your pregnancy. These infections often come back.  °Preventing a UTI in the Future °· Practice good toilet habits. Always wipe from front to back. Use the tissue only once. °· Do not hold your urine. Empty your bladder as soon as possible when the urge comes. °· Do not douche or use deodorant sprays. °· Wash with soap and warm water around the genital area and the anus. °· Empty your bladder before and after sexual intercourse. °· Wear underwear with a cotton crotch. °· Avoid caffeine and carbonated drinks. They can irritate the bladder. °· Drink cranberry juice or take cranberry pills. This may decrease the risk of getting a UTI. °· Do not drink alcohol. °· Keep all your appointments and tests as scheduled.  °SEEK MEDICAL CARE IF:  °· Your symptoms get worse. °· You are still having fevers 2 or more days after treatment begins. °· You have a rash. °· You feel that you are having problems with medicines prescribed. °· You have abnormal vaginal discharge. °SEEK IMMEDIATE MEDICAL CARE IF:  °· You have back or flank pain. °· You have chills. °· You have blood in your urine. °· You have nausea and vomiting. °· You have contractions of your uterus. °· You have a gush of fluid from the vagina. °MAKE SURE YOU: °· Understand these instructions.   °· Will watch your condition.   °· Will get help right away if you are not doing   well or get worse.   °Document Released: 05/16/2010 Document Revised: 11/09/2012 Document Reviewed: 08/18/2012 °ExitCare® Patient Information ©2015 [employer]. This information is not intended to replace advice given to you by your health care provider. Make sure you discuss any questions you have with your health care provider. °Bacterial  Vaginosis °Bacterial vaginosis is a vaginal infection that occurs when the normal balance of bacteria in the vagina is disrupted. It results from an overgrowth of certain bacteria. This is the most common vaginal infection in women of childbearing age. Treatment is important to prevent complications, especially in pregnant women, as it can cause a premature delivery. °CAUSES  °Bacterial vaginosis is caused by an increase in harmful bacteria that are normally present in smaller amounts in the vagina. Several different kinds of bacteria can cause bacterial vaginosis. However, the reason that the condition develops is not fully understood. °RISK FACTORS °Certain activities or behaviors can put you at an increased risk of developing bacterial vaginosis, including: °· Having a new sex partner or multiple sex partners. °· Douching. °· Using an intrauterine device (IUD) for contraception. °Women do not get bacterial vaginosis from toilet seats, bedding, swimming pools, or contact with objects around them. °SIGNS AND SYMPTOMS  °Some women with bacterial vaginosis have no signs or symptoms. Common symptoms include: °· Grey vaginal discharge. °· A fishlike odor with discharge, especially after sexual intercourse. °· Itching or burning of the vagina and vulva. °· Burning or pain with urination. °DIAGNOSIS  °Your health care provider will take a medical history and examine the vagina for signs of bacterial vaginosis. A sample of vaginal fluid may be taken. Your health care provider will look at this sample under a microscope to check for bacteria and abnormal cells. A vaginal pH test may also be done.  °TREATMENT  °Bacterial vaginosis may be treated with antibiotic medicines. These may be given in the form of a pill or a vaginal cream. A second round of antibiotics may be prescribed if the condition comes back after treatment.  °HOME CARE INSTRUCTIONS  °· Only take over-the-counter or prescription medicines as directed by your  health care provider. °· If antibiotic medicine was prescribed, take it as directed. Make sure you finish it even if you start to feel better. °· Do not have sex until treatment is completed. °· Tell all sexual partners that you have a vaginal infection. They should see their health care provider and be treated if they have problems, such as a mild rash or itching. °· Practice safe sex by using condoms and only having one sex partner. °SEEK MEDICAL CARE IF:  °· Your symptoms are not improving after 3 days of treatment. °· You have increased discharge or pain. °· You have a fever. °MAKE SURE YOU:  °· Understand these instructions. °· Will watch your condition. °· Will get help right away if you are not doing well or get worse. °FOR MORE INFORMATION  °Centers for Disease Control and Prevention, Division of STD Prevention: www.cdc.gov/std °American Sexual Health Association (ASHA): www.ashastd.org  °Document Released: 01/19/2005 Document Revised: 11/09/2012 Document Reviewed: 08/31/2012 °ExitCare® Patient Information ©2015 [employer]. This information is not intended to replace advice given to you by your health care provider. Make sure you discuss any questions you have with your health care provider. ° °
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
## 2013-10-24 LAB — GC/CHLAMYDIA PROBE AMP
CT Probe RNA: NEGATIVE
GC Probe RNA: NEGATIVE

## 2013-10-24 LAB — CULTURE, OB URINE: Special Requests: NORMAL

## 2013-10-29 ENCOUNTER — Inpatient Hospital Stay (HOSPITAL_COMMUNITY)
Admission: AD | Admit: 2013-10-29 | Discharge: 2013-10-29 | Disposition: A | Discharge status: Home / Self Care | Payer: Medicaid Other | Source: Ambulatory Visit | Attending: Obstetrics | Admitting: Obstetrics

## 2013-10-29 DIAGNOSIS — O99330 Smoking (tobacco) complicating pregnancy, unspecified trimester: Secondary | ICD-10-CM | POA: Insufficient documentation

## 2013-10-29 DIAGNOSIS — O99891 Other specified diseases and conditions complicating pregnancy: Secondary | ICD-10-CM | POA: Diagnosis present

## 2013-10-29 DIAGNOSIS — O9989 Other specified diseases and conditions complicating pregnancy, childbirth and the puerperium: Principal | ICD-10-CM

## 2013-10-29 DIAGNOSIS — T07XXXA Unspecified multiple injuries, initial encounter: Secondary | ICD-10-CM
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
## 2013-10-29 NOTE — Progress Notes (Signed)
+   FHT's x2 visualized per Dr. Myna Bright 
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
## 2013-11-16 ENCOUNTER — Emergency Department (HOSPITAL_COMMUNITY)
Admission: EM | Admit: 2013-11-16 | Discharge: 2013-11-16 | Disposition: A | Discharge status: Home / Self Care | Payer: Medicaid Other | Attending: Emergency Medicine | Admitting: Emergency Medicine

## 2013-11-16 ENCOUNTER — Encounter (HOSPITAL_COMMUNITY): Payer: Self-pay | Admitting: Emergency Medicine

## 2013-11-16 DIAGNOSIS — Z8619 Personal history of other infectious and parasitic diseases: Secondary | ICD-10-CM | POA: Diagnosis not present

## 2013-11-16 DIAGNOSIS — Z792 Long term (current) use of antibiotics: Secondary | ICD-10-CM | POA: Diagnosis not present

## 2013-11-16 DIAGNOSIS — K088 Other specified disorders of teeth and supporting structures: Secondary | ICD-10-CM | POA: Diagnosis not present

## 2013-11-16 DIAGNOSIS — J45909 Unspecified asthma, uncomplicated: Secondary | ICD-10-CM | POA: Insufficient documentation

## 2013-11-16 DIAGNOSIS — Z79899 Other long term (current) drug therapy: Secondary | ICD-10-CM | POA: Diagnosis not present

## 2013-11-16 DIAGNOSIS — K029 Dental caries, unspecified: Secondary | ICD-10-CM | POA: Insufficient documentation

## 2013-11-16 DIAGNOSIS — Z720 Tobacco use: Secondary | ICD-10-CM | POA: Insufficient documentation

## 2013-11-16 DIAGNOSIS — K0889 Other specified disorders of teeth and supporting structures: Secondary | ICD-10-CM

## 2013-11-16 MED ORDER — AMOXICILLIN 500 MG PO CAPS: 1000.0000 mg | ORAL_CAPSULE | Freq: Two times a day (BID) | ORAL | Status: DC

## 2013-11-16 NOTE — ED Notes (Signed)
Pt. reports right lower molar pain for 1 week unrelieved by prescription Tylenol #3 .

## 2013-11-16 NOTE — ED Notes (Signed)
Pt asleep in the triage room..  Toothache for one week.  She is 4 months pregnant and her doctor  Gave her tylenol with codeine

## 2013-11-16 NOTE — Discharge Instructions (Signed)
Dental Caries °Dental caries (also called tooth decay) is the most common oral disease. It can occur at any age but is more common in children and young adults.  °HOW DENTAL CARIES DEVELOPS  °The process of decay begins when bacteria and foods (particularly sugars and starches) combine in your mouth to produce plaque. Plaque is a substance that sticks to the hard, outer surface of a tooth (enamel). The bacteria in plaque produce acids that attack enamel. These acids may also attack the root surface of a tooth (cementum) if it is exposed. Repeated attacks dissolve these surfaces and create holes in the tooth (cavities). If left untreated, the acids destroy the other layers of the tooth.  °RISK FACTORS °· Frequent sipping of sugary beverages.   °· Frequent snacking on sugary and starchy foods, especially those that easily get stuck in the teeth.   °· Poor oral hygiene.   °· Dry mouth.   °· Substance abuse such as methamphetamine abuse.   °· Broken or poor-fitting dental restorations.   °· Eating disorders.   °· Gastroesophageal reflux disease (GERD).   °· Certain radiation treatments to the head and neck. °SYMPTOMS °In the early stages of dental caries, symptoms are seldom present. Sometimes white, chalky areas may be seen on the enamel or other tooth layers. In later stages, symptoms may include: °· Pits and holes on the enamel. °· Toothache after sweet, hot, or cold foods or drinks are consumed. °· Pain around the tooth. °· Swelling around the tooth. °DIAGNOSIS  °Most of the time, dental caries is detected during a regular dental checkup. A diagnosis is made after a thorough medical and dental history is taken and the surfaces of your teeth are checked for signs of dental caries. Sometimes special instruments, such as lasers, are used to check for dental caries. Dental X-ray exams may be taken so that areas not visible to the eye (such as between the contact areas of the teeth) can be checked for cavities.    °TREATMENT  °If dental caries is in its early stages, it may be reversed with a fluoride treatment or an application of a remineralizing agent at the dental office. Thorough brushing and flossing at home is needed to aid these treatments. If it is in its later stages, treatment depends on the location and extent of tooth destruction:  °· If a small area of the tooth has been destroyed, the destroyed area will be removed and cavities will be filled with a material such as gold, silver amalgam, or composite resin.   °· If a large area of the tooth has been destroyed, the destroyed area will be removed and a cap (crown) will be fitted over the remaining tooth structure.   °· If the center part of the tooth (pulp) is affected, a procedure called a root canal will be needed before a filling or crown can be placed.   °· If most of the tooth has been destroyed, the tooth may need to be pulled (extracted). °HOME CARE INSTRUCTIONS °You can prevent, stop, or reverse dental caries at home by practicing good oral hygiene. Good oral hygiene includes: °· Thoroughly cleaning your teeth at least twice a day with a toothbrush and dental floss.   °· Using a fluoride toothpaste. A fluoride mouth rinse may also be used if recommended by your dentist or health care provider.   °· Restricting the amount of sugary and starchy foods and sugary liquids you consume.   °· Avoiding frequent snacking on these foods and sipping of these liquids.   °· Keeping regular visits with   a dentist for checkups and cleanings. °PREVENTION  °· Practice good oral hygiene. °· Consider a dental sealant. A dental sealant is a coating material that is applied by your dentist to the pits and grooves of teeth. The sealant prevents food from being trapped in them. It may protect the teeth for several years. °· Ask about fluoride supplements if you live in a community without fluorinated water or with water that has a low fluoride content. Use fluoride supplements  as directed by your dentist or health care provider. °· Allow fluoride varnish applications to teeth if directed by your dentist or health care provider. °Document Released: 10/11/2001 Document Revised: 06/05/2013 Document Reviewed: 01/22/2012 °ExitCare® Patient Information ©2015 [employer]. This information is not intended to replace advice given to you by your health care provider. Make sure you discuss any questions you have with your health care provider. ° °Dental Pain °A tooth ache may be caused by cavities (tooth decay). Cavities expose the nerve of the tooth to air and hot or cold temperatures. It may come from an infection or abscess (also called a boil or furuncle) around your tooth. It is also often caused by dental caries (tooth decay). This causes the pain you are having. °DIAGNOSIS  °Your caregiver can diagnose this problem by exam. °TREATMENT  °· If caused by an infection, it may be treated with medications which kill germs (antibiotics) and pain medications as prescribed by your caregiver. Take medications as directed. °· Only take over-the-counter or prescription medicines for pain, discomfort, or fever as directed by your caregiver. °· Whether the tooth ache today is caused by infection or dental disease, you should see your dentist as soon as possible for further care. °SEEK MEDICAL CARE IF: °The exam and treatment you received today has been provided on an emergency basis only. This is not a substitute for complete medical or dental care. If your problem worsens or new problems (symptoms) appear, and you are unable to meet with your dentist, call or return to this location. °SEEK IMMEDIATE MEDICAL CARE IF:  °· You have a fever. °· You develop redness and swelling of your face, jaw, or neck. °· You are unable to open your mouth. °· You have severe pain uncontrolled by pain medicine. °MAKE SURE YOU:  °· Understand these instructions. °· Will watch your condition. °· Will get help right away if  you are not doing well or get worse. °Document Released: 01/19/2005 Document Revised: 04/13/2011 Document Reviewed: 09/07/2007 °ExitCare® Patient Information ©2015 [employer]. This information is not intended to replace advice given to you by your health care provider. Make sure you discuss any questions you have with your health care provider. ° °

## 2013-11-16 NOTE — ED Provider Notes (Signed)
Medical screening examination/treatment/procedure(s) were performed by non-physician practitioner and as supervising physician I was immediately available for consultation/collaboration.   EKG Interpretation None         Ernesteen Mihalic, MD 11/16/13 0519 

## 2013-11-16 NOTE — ED Provider Notes (Signed)
CSN: [id number]     Arrival date & time 11/16/13  0035 History   First MD Initiated Contact with Patient 11/16/13 0105     Chief Complaint  Patient presents with  . Dental Pain     (Consider location/radiation/quality/duration/timing/severity/associated sxs/prior Treatment) Patient is a 23 y.o. female presenting with tooth pain. The history is provided by the patient. No language interpreter was used.  Dental Pain Location:  Lower Lower teeth location:  31/RL 2nd molar and 30/RL 1st molar Quality:  Throbbing Onset quality:  Gradual Duration:  8 months Timing:  Constant Associated symptoms: no facial swelling and no fever   Associated symptoms comment:  Recurrent molar pain in tooth with known decay. No facial swelling, fever. Her doctor (Dr. Gaynell FaceMarshall) provided Tylenol #3 for pain but she reports it is not controlling her pain.   Past Medical History  Diagnosis Date  . No pertinent past medical history   . PIH (pregnancy induced hypertension)   . Asthma   . Pregnancy induced hypertension   . Trichomonas   . Miscarriage within last 12 months    Past Surgical History  Procedure Laterality Date  . Dilation and curettage of uterus    . Induced abortion     Family History  Problem Relation Age of Onset  . Hypertension Mother   . Cancer Paternal Uncle   . Asthma Sister    History  Substance Use Topics  . Smoking status: Current Every Day Smoker -- 1.50 packs/day    Types: Cigarettes  . Smokeless tobacco: Never Used  . Alcohol Use: Yes     Comment: 1-2 blunts a day, usually drinks a bottle of Brandy or some other kink of liquor a week.   OB History   Grav Para Term Preterm Abortions TAB SAB Ect Mult Living   7 1 1  5 2 3  1 2      Review of Systems  Constitutional: Negative for fever and chills.  HENT: Positive for dental problem. Negative for facial swelling and trouble swallowing.   Respiratory: Negative.   Cardiovascular: Negative.   Gastrointestinal: Negative.    Musculoskeletal: Negative.   Skin: Negative.   Neurological: Negative.       Allergies  Shellfish allergy  Home Medications   Prior to Admission medications   Medication Sig Start Date End Date Taking? Authorizing Provider  acetaminophen-codeine (TYLENOL #3) 300-30 MG per tablet Take 1 tablet by mouth every 4 (four) hours as needed for severe pain. 10/21/13  Yes Dorathy KinsmanVirginia Smith, CNM  ferrous fumarate (HEMOCYTE - 106 MG FE) 325 (106 FE) MG TABS tablet Take 1 tablet by mouth daily.   Yes Historical Provider, MD  metroNIDAZOLE (FLAGYL) 500 MG tablet Take 1 tablet (500 mg total) by mouth 2 (two) times daily. 10/21/13  Yes Dorathy KinsmanVirginia Smith, CNM  nitrofurantoin, macrocrystal-monohydrate, (MACROBID) 100 MG capsule Take 1 capsule (100 mg total) by mouth 2 (two) times daily. 10/21/13  Yes Dorathy KinsmanVirginia Smith, CNM  Ondansetron (ZOFRAN ODT PO) Take 1 tablet by mouth daily as needed (nausea).   Yes Historical Provider, MD  Prenatal Vit-Fe Fumarate-FA (PRENATAL MULTIVITAMIN) TABS tablet Take 2 tablets by mouth daily.    Yes Historical Provider, MD   BP 110/65  Pulse 102  Temp(Src) 98.1 F (36.7 C) (Oral)  Resp 18  Ht 5\' 2"  (1.575 m)  Wt 115 lb (52.164 kg)  BMI 21.03 kg/m2  SpO2 100%  LMP 07/27/2013 Physical Exam  Constitutional: She is oriented to person, place, and time.  She appears well-developed and well-nourished.  HENT:  Mouth/Throat: Oropharynx is clear and moist.  Significant decay to #39 and 30. No gingival swelling. NO facial swelling.   Neck: Normal range of motion.  Pulmonary/Chest: Effort normal.  Neurological: She is alert and oriented to person, place, and time.  Skin: Skin is warm and dry.    ED Course  Procedures (including critical care time) Labs Review Labs Reviewed - No data to display  Imaging Review No results found.   EKG Interpretation None      MDM   Final diagnoses:  None    1. Dental pain 2. Dental caries  Local injection with lidocaine provided  with relief of pain. She will continue Tylenol #3 given to her by her OB, Dr. Gaynell FaceMarshall. Will cover with amoxil as she has had multiple dental infections, and encourage dental follow up.    Arnoldo HookerShari A Myosha Cuadras, PA-C 11/16/13 [phone]

## 2013-11-17 ENCOUNTER — Inpatient Hospital Stay (HOSPITAL_COMMUNITY)
Admission: AD | Admit: 2013-11-17 | Discharge: 2013-11-17 | Disposition: A | Discharge status: Home / Self Care | Payer: Medicaid Other | Source: Ambulatory Visit | Attending: Obstetrics | Admitting: Obstetrics

## 2013-11-17 DIAGNOSIS — R102 Pelvic and perineal pain: Secondary | ICD-10-CM | POA: Insufficient documentation

## 2013-11-17 DIAGNOSIS — R109 Unspecified abdominal pain: Secondary | ICD-10-CM | POA: Insufficient documentation

## 2013-11-17 DIAGNOSIS — Z3A16 16 weeks gestation of pregnancy: Secondary | ICD-10-CM | POA: Insufficient documentation

## 2013-11-17 DIAGNOSIS — K088 Other specified disorders of teeth and supporting structures: Secondary | ICD-10-CM | POA: Diagnosis not present

## 2013-11-17 DIAGNOSIS — M549 Dorsalgia, unspecified: Secondary | ICD-10-CM | POA: Insufficient documentation

## 2013-11-17 DIAGNOSIS — H9209 Otalgia, unspecified ear: Secondary | ICD-10-CM | POA: Diagnosis not present

## 2013-11-17 DIAGNOSIS — N949 Unspecified condition associated with female genital organs and menstrual cycle: Secondary | ICD-10-CM

## 2013-11-17 LAB — URINALYSIS, ROUTINE W REFLEX MICROSCOPIC
BILIRUBIN URINE: NEGATIVE
GLUCOSE, UA: NEGATIVE mg/dL
Ketones, ur: NEGATIVE mg/dL
LEUKOCYTES UA: NEGATIVE
Nitrite: POSITIVE — AB
PH: 6 (ref 5.0–8.0)
Protein, ur: 30 mg/dL — AB
Specific Gravity, Urine: 1.025 (ref 1.005–1.030)
Urobilinogen, UA: 1 mg/dL (ref 0.0–1.0)

## 2013-11-17 LAB — URINE MICROSCOPIC-ADD ON

## 2013-11-17 NOTE — MAU Note (Signed)
Thressa ShellerHeather Hogan CNM in triage to see pt and discuss plan of care with pt.

## 2013-11-17 NOTE — MAU Note (Signed)
Pain in LLQ since Tues when I started taking Tylenol #3 for hole in my tooth. R side of face and ear hurts.

## 2013-11-17 NOTE — MAU Provider Note (Signed)
History     CSN: [id number]  Arrival date and time: 11/17/13 2125   First Provider Initiated Contact with Patient 11/17/13 2323      Chief Complaint  Patient presents with  . Abdominal Cramping  . Back Pain  . Dental Pain  . Otalgia   HPI  Julia Cain is a 23 y.o. [phone] at 2761w1d who presents today with abdominal pain. She states that the pain is along the left side of her abdomen. She denies any vaginal bleeding or LOF. She states that she has been feeling the babies move. She has been taking Tylenol #3 for a toothache. She states that she has a BM most days. She is also on iron supplements.   Past Medical History  Diagnosis Date  . No pertinent past medical history   . PIH (pregnancy induced hypertension)   . Asthma   . Pregnancy induced hypertension   . Trichomonas   . Miscarriage within last 12 months     Past Surgical History  Procedure Laterality Date  . Dilation and curettage of uterus    . Induced abortion      Family History  Problem Relation Age of Onset  . Hypertension Mother   . Cancer Paternal Uncle   . Asthma Sister     History  Substance Use Topics  . Smoking status: Current Every Day Smoker -- 1.50 packs/day    Types: Cigarettes  . Smokeless tobacco: Never Used  . Alcohol Use: Yes     Comment: 1-2 blunts a day, usually drinks a bottle of Brandy or some other kink of liquor a week.    Allergies:  Allergies  Allergen Reactions  . Shellfish Allergy Itching, Swelling and Rash    Prescriptions prior to admission  Medication Sig Dispense Refill  . acetaminophen-codeine (TYLENOL #3) 300-30 MG per tablet Take 1 tablet by mouth every 4 (four) hours as needed for severe pain.  15 tablet  0  . amoxicillin (AMOXIL) 500 MG capsule Take 2 capsules (1,000 mg total) by mouth 2 (two) times daily.  40 capsule  0  . ferrous fumarate (HEMOCYTE - 106 MG FE) 325 (106 FE) MG TABS tablet Take 1 tablet by mouth daily.      . metroNIDAZOLE (FLAGYL) 500  MG tablet Take 1 tablet (500 mg total) by mouth 2 (two) times daily.  14 tablet  0  . nitrofurantoin, macrocrystal-monohydrate, (MACROBID) 100 MG capsule Take 1 capsule (100 mg total) by mouth 2 (two) times daily.  14 capsule  0  . Ondansetron (ZOFRAN ODT PO) Take 1 tablet by mouth daily as needed (nausea).      . Prenatal Vit-Fe Fumarate-FA (PRENATAL MULTIVITAMIN) TABS tablet Take 2 tablets by mouth daily.         ROS Physical Exam   Blood pressure 109/77, pulse 98, temperature 99.3 F (37.4 C), resp. rate 20, height 5\' 2"  (1.575 m), weight 51.891 kg (114 lb 6.4 oz), last menstrual period 07/27/2013, SpO2 100.00%.  Physical Exam  Nursing note and vitals reviewed. Constitutional: She is oriented to person, place, and time. She appears well-developed and well-nourished. No distress.  Cardiovascular: Normal rate.   Respiratory: Effort normal.  GI: Soft. There is no tenderness. There is no rebound.  Neurological: She is alert and oriented to person, place, and time.  Skin: Skin is warm and dry.  Psychiatric: She has a normal mood and affect.    MAU Course  Procedures   Assessment and Plan  1. Round ligament pain    2nd trimester precautions reviewed Return to MAU as needed Keep dental appointment for Monday Continue abx for dental infection   Tawnya CrookHogan, Heather Donovan 11/17/2013, 11:26 PM

## 2013-11-17 NOTE — Discharge Instructions (Signed)

## 2013-11-28 ENCOUNTER — Other Ambulatory Visit (HOSPITAL_COMMUNITY): Payer: Self-pay | Admitting: Obstetrics

## 2013-11-28 DIAGNOSIS — O3092 Multiple gestation, unspecified, second trimester: Secondary | ICD-10-CM

## 2013-12-04 ENCOUNTER — Encounter (HOSPITAL_COMMUNITY): Payer: Self-pay | Admitting: Emergency Medicine

## 2013-12-06 ENCOUNTER — Ambulatory Visit (HOSPITAL_COMMUNITY)
Admission: RE | Admit: 2013-12-06 | Discharge: 2013-12-06 | Disposition: A | Discharge status: Home / Self Care | Payer: Medicaid Other | Source: Ambulatory Visit | Attending: Obstetrics | Admitting: Obstetrics

## 2013-12-06 ENCOUNTER — Encounter (HOSPITAL_COMMUNITY): Payer: Self-pay

## 2013-12-06 ENCOUNTER — Other Ambulatory Visit (HOSPITAL_COMMUNITY): Payer: Self-pay | Admitting: Obstetrics

## 2013-12-06 VITALS — BP 99/59 | HR 107 | Wt 115.5 lb

## 2013-12-06 DIAGNOSIS — O3092 Multiple gestation, unspecified, second trimester: Secondary | ICD-10-CM

## 2013-12-07 DIAGNOSIS — O30042 Twin pregnancy, dichorionic/diamniotic, second trimester: Secondary | ICD-10-CM | POA: Insufficient documentation

## 2013-12-07 DIAGNOSIS — O359XX0 Maternal care for (suspected) fetal abnormality and damage, unspecified, not applicable or unspecified: Secondary | ICD-10-CM | POA: Insufficient documentation

## 2013-12-08 ENCOUNTER — Other Ambulatory Visit (HOSPITAL_COMMUNITY): Payer: Self-pay

## 2013-12-11 ENCOUNTER — Ambulatory Visit (HOSPITAL_COMMUNITY)
Admission: RE | Admit: 2013-12-11 | Discharge: 2013-12-11 | Disposition: A | Discharge status: Home / Self Care | Payer: Medicaid Other | Source: Ambulatory Visit | Attending: Obstetrics | Admitting: Obstetrics

## 2013-12-11 ENCOUNTER — Other Ambulatory Visit (HOSPITAL_COMMUNITY): Payer: Self-pay | Admitting: Obstetrics

## 2013-12-11 ENCOUNTER — Encounter (HOSPITAL_COMMUNITY): Payer: Self-pay

## 2013-12-11 DIAGNOSIS — O30042 Twin pregnancy, dichorionic/diamniotic, second trimester: Secondary | ICD-10-CM | POA: Diagnosis present

## 2013-12-11 DIAGNOSIS — O09292 Supervision of pregnancy with other poor reproductive or obstetric history, second trimester: Secondary | ICD-10-CM | POA: Diagnosis not present

## 2013-12-11 DIAGNOSIS — O3092 Multiple gestation, unspecified, second trimester: Secondary | ICD-10-CM

## 2013-12-11 DIAGNOSIS — Z3A19 19 weeks gestation of pregnancy: Secondary | ICD-10-CM | POA: Insufficient documentation

## 2013-12-11 DIAGNOSIS — O30049 Twin pregnancy, dichorionic/diamniotic, unspecified trimester: Secondary | ICD-10-CM

## 2013-12-11 DIAGNOSIS — O318X22 Other complications specific to multiple gestation, second trimester, fetus 2: Secondary | ICD-10-CM | POA: Insufficient documentation

## 2013-12-11 DIAGNOSIS — IMO0002 Reserved for concepts with insufficient information to code with codable children: Secondary | ICD-10-CM | POA: Insufficient documentation

## 2013-12-13 LAB — TOXOPLASMA ANTIBODIES- IGG AND  IGM: Toxoplasma Antibody- IgM: <3 IU/mL (ref <8.0)

## 2013-12-13 LAB — CMV IGM: CMV IgM: 11.3 AU/mL (ref <30.00)

## 2013-12-13 LAB — PARVOVIRUS B19 ANTIBODY, IGG AND IGM
Parovirus B19 IgG Abs: 5.1 index — ABNORMAL HIGH (ref <0.9)
Parovirus B19 IgM Abs: 1.3 index — ABNORMAL HIGH (ref <0.9)

## 2013-12-13 LAB — CMV ANTIBODY, IGG (EIA): CMV AB - IGG: 2.6 U/mL — AB (ref <0.60)

## 2013-12-18 ENCOUNTER — Telehealth (HOSPITAL_COMMUNITY): Payer: Self-pay | Admitting: *Deleted
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
## 2013-12-18 NOTE — Telephone Encounter (Signed)
Left message for pt to call back [facility] and [facility]

## 2013-12-19 ENCOUNTER — Ambulatory Visit (HOSPITAL_COMMUNITY): Payer: Medicaid Other

## 2013-12-19 ENCOUNTER — Other Ambulatory Visit (HOSPITAL_COMMUNITY): Payer: Self-pay | Admitting: Obstetrics

## 2013-12-19 DIAGNOSIS — O98512 Other viral diseases complicating pregnancy, second trimester: Secondary | ICD-10-CM

## 2013-12-19 DIAGNOSIS — B343 Parvovirus infection, unspecified: Secondary | ICD-10-CM

## 2013-12-19 DIAGNOSIS — IMO0001 Reserved for inherently not codable concepts without codable children: Secondary | ICD-10-CM

## 2013-12-21 ENCOUNTER — Inpatient Hospital Stay (HOSPITAL_COMMUNITY)
Admission: AD | Admit: 2013-12-21 | Discharge: 2013-12-21 | Disposition: A | Discharge status: Home / Self Care | Payer: Medicaid Other | Source: Ambulatory Visit | Attending: Obstetrics | Admitting: Obstetrics

## 2013-12-21 ENCOUNTER — Encounter (HOSPITAL_COMMUNITY): Payer: Self-pay | Admitting: *Deleted

## 2013-12-21 DIAGNOSIS — R102 Pelvic and perineal pain: Secondary | ICD-10-CM | POA: Diagnosis not present

## 2013-12-21 DIAGNOSIS — O99332 Smoking (tobacco) complicating pregnancy, second trimester: Secondary | ICD-10-CM | POA: Diagnosis not present

## 2013-12-21 DIAGNOSIS — O26892 Other specified pregnancy related conditions, second trimester: Secondary | ICD-10-CM | POA: Insufficient documentation

## 2013-12-21 DIAGNOSIS — Z3A21 21 weeks gestation of pregnancy: Secondary | ICD-10-CM | POA: Insufficient documentation

## 2013-12-21 DIAGNOSIS — I313 Pericardial effusion (noninflammatory): Secondary | ICD-10-CM | POA: Diagnosis not present

## 2013-12-21 DIAGNOSIS — O30002 Twin pregnancy, unspecified number of placenta and unspecified number of amniotic sacs, second trimester: Secondary | ICD-10-CM | POA: Insufficient documentation

## 2013-12-21 DIAGNOSIS — N949 Unspecified condition associated with female genital organs and menstrual cycle: Secondary | ICD-10-CM

## 2013-12-21 LAB — URINALYSIS, ROUTINE W REFLEX MICROSCOPIC
Bilirubin Urine: NEGATIVE
Glucose, UA: NEGATIVE mg/dL
KETONES UR: 15 mg/dL — AB
Leukocytes, UA: NEGATIVE
NITRITE: NEGATIVE
PROTEIN: 30 mg/dL — AB
Specific Gravity, Urine: 1.025 (ref 1.005–1.030)
UROBILINOGEN UA: 1 mg/dL (ref 0.0–1.0)
pH: 7 (ref 5.0–8.0)

## 2013-12-21 LAB — URINE MICROSCOPIC-ADD ON

## 2013-12-21 NOTE — MAU Provider Note (Signed)
History     CSN: [id number]  Arrival date and time: 12/21/13 0007   First Provider Initiated Contact with Patient 12/21/13 0046      Chief Complaint  Patient presents with  . Abdominal Pain  . Vaginal Pain  . Decreased Fetal Movement   HPI  [name] is a 23 y.o. [phone] at 6641w0d with twin gestation who presents to MAU today with complaint of abdominal pain and vaginal pressure. Patient is being followed by MFM for twin gestation and fetal anomaly of pericardial effusion. Last US on 12/11/13 showed cervical length of 3.3 cm and normal AFI, placenta. Patient states she had another [facility] this week and was not told of any major change. She states abdominal pain was worse earlier. Pain is worse with ambulation. She states that she has done a lot of walking at work this week. She states pain is improved with rest and denies any pain right now while laying in bed. She does also endorse some vaginal pressure. She denies vaginal bleeding, discharge or LOF. She states good fetal movement, but less movement of baby B.   OB History    Gravida Para Term Preterm AB TAB SAB Ectopic Multiple Living   5 1 1  3 1 2  1 2       Past Medical History  Diagnosis Date  . No pertinent past medical history   . PIH (pregnancy induced hypertension)   . Asthma   . Pregnancy induced hypertension   . Trichomonas   . Miscarriage within last 12 months     Past Surgical History  Procedure Laterality Date  . Dilation and curettage of uterus    . Induced abortion      Family History  Problem Relation Age of Onset  . Hypertension Mother   . Cancer Paternal Uncle   . Asthma Sister     History  Substance Use Topics  . Smoking status: Current Every Day Smoker -- 0.25 packs/day    Types: Cigarettes  . Smokeless tobacco: Never Used  . Alcohol Use: No     Comment: 1-2 blunts a day, usually drinks a bottle of Brandy or some other kink of liquor a week.    Allergies:  Allergies   Allergen Reactions  . Shellfish Allergy Itching, Swelling and Rash    Prescriptions prior to admission  Medication Sig Dispense Refill Last Dose  . acetaminophen-codeine (TYLENOL #3) 300-30 MG per tablet Take 1 tablet by mouth every 4 (four) hours as needed for severe pain. 15 tablet 0 Not Taking  . amoxicillin (AMOXIL) 500 MG capsule Take 2 capsules (1,000 mg total) by mouth 2 (two) times daily. 40 capsule 0 Not Taking  . ferrous fumarate (HEMOCYTE - 106 MG FE) 325 (106 FE) MG TABS tablet Take 1 tablet by mouth daily.   Not Taking  . metroNIDAZOLE (FLAGYL) 500 MG tablet Take 1 tablet (500 mg total) by mouth 2 (two) times daily. 14 tablet 0 Not Taking  . nitrofurantoin, macrocrystal-monohydrate, (MACROBID) 100 MG capsule Take 1 capsule (100 mg total) by mouth 2 (two) times daily. 14 capsule 0 Not Taking  . Ondansetron (ZOFRAN ODT PO) Take 1 tablet by mouth daily as needed (nausea).   Not Taking  . Prenatal Vit-Fe Fumarate-FA (PRENATAL MULTIVITAMIN) TABS tablet Take 2 tablets by mouth daily.    Taking    Review of Systems  Constitutional: Negative for fever and malaise/fatigue.  Gastrointestinal: Positive for abdominal pain. Negative for nausea and  vomiting.  Genitourinary: Negative for dysuria, urgency and frequency.       Neg - vaginal bleeding, discharge, LOF   Physical Exam   Blood pressure 100/50, pulse 96, temperature 98.6 F (37 C), temperature source Oral, resp. rate 18, height 5\' 2"  (1.575 m), weight 120 lb (54.432 kg), last menstrual period 07/27/2013, SpO2 100 %.  Physical Exam  Constitutional: She is oriented to person, place, and time. She appears well-developed and well-nourished. No distress.  HENT:  Head: Normocephalic.  Cardiovascular: Normal rate.   Respiratory: Effort normal.  GI: Soft. She exhibits no distension and no mass. There is no tenderness. There is no rebound and no guarding.  Palpable movement of both babies  Neurological: She is alert and oriented to  person, place, and time.  Skin: Skin is warm and dry. No erythema.  Psychiatric: She has a normal mood and affect.   Dilation: Closed Effacement (%): Thick Cervical Position: Posterior Exam by:: Julia Cain, Julia Cain  MAU Course  Procedures None  MDM FHR A -130 bpm with doppler FHR B -125 bpm with doppler  Assessment and Plan  A: Twin gestation at 1217w0d Round ligament pain Fetal pericardial effusion  P: Discharge home Discussed use of abdominal binder for pain Discussed warning signs for preterm labor Patient advised to follow-up with Dr. Gaynell FaceMarshall and MFM as scheduled Patient may return to MAU as needed or if her condition were to change or worsen   Marny LowensteinJulie N Wenzel, Cain-C  12/21/2013, 12:47 AM

## 2013-12-21 NOTE — MAU Note (Signed)
Pt reports she has been having lower abd pain, vaginal pain, and rt side pain for the last 8 hours. Pt reports she is pregnant with twins and baby B has not moved since last pm.

## 2013-12-21 NOTE — Discharge Instructions (Signed)
Abdominal Pain During Pregnancy °Belly (abdominal) pain is common during pregnancy. Most of the time, it is not a serious problem. Other times, it can be a sign that something is wrong with the pregnancy. Always tell your doctor if you have belly pain. °HOME CARE °Monitor your belly pain for any changes. The following actions may help you feel better: °· Do not have sex (intercourse) or put anything in your vagina until you feel better. °· Rest until your pain stops. °· Drink clear fluids if you feel sick to your stomach (nauseous). Do not eat solid food until you feel better. °· Only take medicine as told by your doctor. °· Keep all doctor visits as told. °GET HELP RIGHT AWAY IF:  °· You are bleeding, leaking fluid, or pieces of tissue come out of your vagina. °· You have more pain or cramping. °· You keep throwing up (vomiting). °· You have pain when you pee (urinate) or have blood in your pee. °· You have a fever. °· You do not feel your baby moving as much. °· You feel very weak or feel like passing out. °· You have trouble breathing, with or without belly pain. °· You have a very bad headache and belly pain. °· You have fluid leaking from your vagina and belly pain. °· You keep having watery poop (diarrhea). °· Your belly pain does not go away after resting, or the pain gets worse. °MAKE SURE YOU:  °· Understand these instructions. °· Will watch your condition. °· Will get help right away if you are not doing well or get worse. °Document Released: 01/07/2009 Document Revised: 09/21/2012 Document Reviewed: 08/18/2012 °ExitCare® Patient Information ©2015 [employer]. This information is not intended to replace advice given to you by your health care provider. Make sure you discuss any questions you have with your health care provider. ° °

## 2013-12-22 ENCOUNTER — Ambulatory Visit (HOSPITAL_COMMUNITY)
Admission: RE | Admit: 2013-12-22 | Discharge: 2013-12-22 | Disposition: A | Discharge status: Home / Self Care | Payer: Medicaid Other | Source: Ambulatory Visit | Attending: Obstetrics | Admitting: Obstetrics

## 2013-12-22 ENCOUNTER — Other Ambulatory Visit (HOSPITAL_COMMUNITY): Payer: Self-pay | Admitting: Obstetrics

## 2013-12-22 ENCOUNTER — Encounter (HOSPITAL_COMMUNITY): Payer: Self-pay

## 2013-12-22 DIAGNOSIS — O30042 Twin pregnancy, dichorionic/diamniotic, second trimester: Secondary | ICD-10-CM | POA: Insufficient documentation

## 2013-12-22 DIAGNOSIS — IMO0001 Reserved for inherently not codable concepts without codable children: Secondary | ICD-10-CM

## 2013-12-22 DIAGNOSIS — B343 Parvovirus infection, unspecified: Secondary | ICD-10-CM | POA: Insufficient documentation

## 2013-12-22 DIAGNOSIS — O09292 Supervision of pregnancy with other poor reproductive or obstetric history, second trimester: Secondary | ICD-10-CM | POA: Diagnosis not present

## 2013-12-22 DIAGNOSIS — O98512 Other viral diseases complicating pregnancy, second trimester: Secondary | ICD-10-CM

## 2013-12-22 DIAGNOSIS — Z3A21 21 weeks gestation of pregnancy: Secondary | ICD-10-CM | POA: Insufficient documentation

## 2013-12-22 DIAGNOSIS — O98519 Other viral diseases complicating pregnancy, unspecified trimester: Secondary | ICD-10-CM

## 2013-12-22 DIAGNOSIS — O318X22 Other complications specific to multiple gestation, second trimester, fetus 2: Secondary | ICD-10-CM | POA: Diagnosis not present

## 2013-12-22 DIAGNOSIS — O35BXX0 Maternal care for other (suspected) fetal abnormality and damage, fetal cardiac anomalies, not applicable or unspecified: Secondary | ICD-10-CM | POA: Insufficient documentation

## 2013-12-22 DIAGNOSIS — O358XX0 Maternal care for other (suspected) fetal abnormality and damage, not applicable or unspecified: Secondary | ICD-10-CM | POA: Insufficient documentation

## 2013-12-26 ENCOUNTER — Encounter (HOSPITAL_COMMUNITY): Payer: Self-pay

## 2013-12-26 ENCOUNTER — Ambulatory Visit (HOSPITAL_COMMUNITY)
Admission: RE | Admit: 2013-12-26 | Discharge: 2013-12-26 | Disposition: A | Discharge status: Home / Self Care | Payer: Medicaid Other | Source: Ambulatory Visit | Attending: Obstetrics | Admitting: Obstetrics

## 2013-12-26 ENCOUNTER — Other Ambulatory Visit (HOSPITAL_COMMUNITY): Payer: Self-pay | Admitting: Obstetrics

## 2013-12-26 DIAGNOSIS — O30049 Twin pregnancy, dichorionic/diamniotic, unspecified trimester: Secondary | ICD-10-CM

## 2013-12-26 DIAGNOSIS — O359XX0 Maternal care for (suspected) fetal abnormality and damage, unspecified, not applicable or unspecified: Secondary | ICD-10-CM | POA: Diagnosis present

## 2013-12-26 DIAGNOSIS — O09299 Supervision of pregnancy with other poor reproductive or obstetric history, unspecified trimester: Secondary | ICD-10-CM | POA: Diagnosis present

## 2013-12-26 DIAGNOSIS — O98519 Other viral diseases complicating pregnancy, unspecified trimester: Secondary | ICD-10-CM | POA: Insufficient documentation

## 2013-12-26 DIAGNOSIS — W19XXXA Unspecified fall, initial encounter: Secondary | ICD-10-CM | POA: Insufficient documentation

## 2013-12-26 DIAGNOSIS — IMO0002 Reserved for concepts with insufficient information to code with codable children: Secondary | ICD-10-CM | POA: Insufficient documentation

## 2013-12-26 DIAGNOSIS — B343 Parvovirus infection, unspecified: Secondary | ICD-10-CM | POA: Insufficient documentation

## 2013-12-26 DIAGNOSIS — Z3A21 21 weeks gestation of pregnancy: Secondary | ICD-10-CM | POA: Insufficient documentation
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
## 2013-12-29 ENCOUNTER — Other Ambulatory Visit (HOSPITAL_COMMUNITY): Payer: Self-pay | Admitting: Obstetrics

## 2013-12-29 ENCOUNTER — Ambulatory Visit (HOSPITAL_COMMUNITY)
Admission: RE | Admit: 2013-12-29 | Discharge: 2013-12-29 | Disposition: A | Discharge status: Home / Self Care | Payer: Medicaid Other | Source: Ambulatory Visit | Attending: Obstetrics | Admitting: Obstetrics

## 2013-12-29 DIAGNOSIS — Z3A22 22 weeks gestation of pregnancy: Secondary | ICD-10-CM | POA: Insufficient documentation

## 2013-12-29 DIAGNOSIS — O09299 Supervision of pregnancy with other poor reproductive or obstetric history, unspecified trimester: Secondary | ICD-10-CM | POA: Diagnosis present

## 2013-12-29 DIAGNOSIS — IMO0001 Reserved for inherently not codable concepts without codable children: Secondary | ICD-10-CM

## 2013-12-29 DIAGNOSIS — O30049 Twin pregnancy, dichorionic/diamniotic, unspecified trimester: Secondary | ICD-10-CM | POA: Insufficient documentation

## 2013-12-29 DIAGNOSIS — B343 Parvovirus infection, unspecified: Secondary | ICD-10-CM

## 2013-12-29 DIAGNOSIS — O359XX0 Maternal care for (suspected) fetal abnormality and damage, unspecified, not applicable or unspecified: Secondary | ICD-10-CM | POA: Insufficient documentation

## 2013-12-29 DIAGNOSIS — O98512 Other viral diseases complicating pregnancy, second trimester: Secondary | ICD-10-CM

## 2013-12-29 DIAGNOSIS — IMO0002 Reserved for concepts with insufficient information to code with codable children: Secondary | ICD-10-CM

## 2013-12-29 DIAGNOSIS — O98519 Other viral diseases complicating pregnancy, unspecified trimester: Secondary | ICD-10-CM | POA: Diagnosis not present

## 2014-01-05 ENCOUNTER — Encounter (HOSPITAL_COMMUNITY): Payer: Self-pay | Admitting: Obstetrics

## 2014-01-05 ENCOUNTER — Other Ambulatory Visit (HOSPITAL_COMMUNITY): Payer: Self-pay | Admitting: Maternal and Fetal Medicine

## 2014-01-05 ENCOUNTER — Other Ambulatory Visit (HOSPITAL_COMMUNITY): Payer: Self-pay | Admitting: Obstetrics

## 2014-01-05 ENCOUNTER — Encounter (HOSPITAL_COMMUNITY): Payer: Self-pay

## 2014-01-05 ENCOUNTER — Ambulatory Visit (HOSPITAL_COMMUNITY)
Admission: RE | Admit: 2014-01-05 | Discharge: 2014-01-05 | Disposition: A | Discharge status: Home / Self Care | Payer: Medicaid Other | Source: Ambulatory Visit | Attending: Obstetrics | Admitting: Obstetrics

## 2014-01-05 DIAGNOSIS — O98512 Other viral diseases complicating pregnancy, second trimester: Secondary | ICD-10-CM | POA: Diagnosis not present

## 2014-01-05 DIAGNOSIS — B343 Parvovirus infection, unspecified: Secondary | ICD-10-CM

## 2014-01-05 DIAGNOSIS — IMO0001 Reserved for inherently not codable concepts without codable children: Secondary | ICD-10-CM

## 2014-01-05 DIAGNOSIS — IMO0002 Reserved for concepts with insufficient information to code with codable children: Secondary | ICD-10-CM

## 2014-01-05 DIAGNOSIS — O358XX0 Maternal care for other (suspected) fetal abnormality and damage, not applicable or unspecified: Secondary | ICD-10-CM | POA: Diagnosis present

## 2014-01-05 DIAGNOSIS — O30049 Twin pregnancy, dichorionic/diamniotic, unspecified trimester: Secondary | ICD-10-CM

## 2014-01-05 DIAGNOSIS — O30042 Twin pregnancy, dichorionic/diamniotic, second trimester: Secondary | ICD-10-CM | POA: Insufficient documentation

## 2014-01-05 DIAGNOSIS — Z3A23 23 weeks gestation of pregnancy: Secondary | ICD-10-CM | POA: Insufficient documentation

## 2014-01-09 ENCOUNTER — Other Ambulatory Visit (HOSPITAL_COMMUNITY): Payer: Self-pay | Admitting: Obstetrics

## 2014-01-09 DIAGNOSIS — B343 Parvovirus infection, unspecified: Secondary | ICD-10-CM

## 2014-01-09 DIAGNOSIS — IMO0001 Reserved for inherently not codable concepts without codable children: Secondary | ICD-10-CM

## 2014-01-09 DIAGNOSIS — O98512 Other viral diseases complicating pregnancy, second trimester: Secondary | ICD-10-CM

## 2014-01-12 ENCOUNTER — Other Ambulatory Visit (HOSPITAL_COMMUNITY): Payer: Self-pay | Admitting: Obstetrics

## 2014-01-12 ENCOUNTER — Ambulatory Visit (HOSPITAL_COMMUNITY)
Admission: RE | Admit: 2014-01-12 | Discharge: 2014-01-12 | Disposition: A | Discharge status: Home / Self Care | Payer: Medicaid Other | Source: Ambulatory Visit | Attending: Obstetrics | Admitting: Obstetrics

## 2014-01-12 ENCOUNTER — Encounter (HOSPITAL_COMMUNITY): Payer: Self-pay

## 2014-01-12 VITALS — BP 106/53 | HR 98 | Wt 128.0 lb

## 2014-01-12 DIAGNOSIS — B343 Parvovirus infection, unspecified: Secondary | ICD-10-CM

## 2014-01-12 DIAGNOSIS — IMO0001 Reserved for inherently not codable concepts without codable children: Secondary | ICD-10-CM

## 2014-01-12 DIAGNOSIS — O98512 Other viral diseases complicating pregnancy, second trimester: Secondary | ICD-10-CM

## 2014-01-12 DIAGNOSIS — Z3A24 24 weeks gestation of pregnancy: Secondary | ICD-10-CM | POA: Insufficient documentation

## 2014-01-13 ENCOUNTER — Observation Stay (HOSPITAL_COMMUNITY)
Admission: AD | Admit: 2014-01-13 | Discharge: 2014-01-14 | Disposition: A | Discharge status: Home / Self Care | Payer: Medicaid Other | Source: Ambulatory Visit | Attending: Obstetrics | Admitting: Obstetrics

## 2014-01-13 ENCOUNTER — Encounter (HOSPITAL_COMMUNITY): Payer: Self-pay | Admitting: *Deleted

## 2014-01-13 ENCOUNTER — Inpatient Hospital Stay (HOSPITAL_COMMUNITY): Payer: Medicaid Other

## 2014-01-13 ENCOUNTER — Encounter (HOSPITAL_COMMUNITY): Payer: Self-pay | Admitting: Obstetrics

## 2014-01-13 ENCOUNTER — Inpatient Hospital Stay (HOSPITAL_COMMUNITY)
Admission: AD | Admit: 2014-01-13 | Discharge: 2014-01-13 | DRG: 781 | Discharge status: Against Medical Advice | Payer: Medicaid Other | Source: Ambulatory Visit | Attending: Obstetrics | Admitting: Obstetrics

## 2014-01-13 DIAGNOSIS — O4702 False labor before 37 completed weeks of gestation, second trimester: Secondary | ICD-10-CM | POA: Insufficient documentation

## 2014-01-13 DIAGNOSIS — J45909 Unspecified asthma, uncomplicated: Secondary | ICD-10-CM | POA: Insufficient documentation

## 2014-01-13 DIAGNOSIS — O368120 Decreased fetal movements, second trimester, not applicable or unspecified: Secondary | ICD-10-CM | POA: Diagnosis present

## 2014-01-13 DIAGNOSIS — S3991XA Unspecified injury of abdomen, initial encounter: Secondary | ICD-10-CM | POA: Diagnosis not present

## 2014-01-13 DIAGNOSIS — Z8249 Family history of ischemic heart disease and other diseases of the circulatory system: Secondary | ICD-10-CM | POA: Diagnosis not present

## 2014-01-13 DIAGNOSIS — Y9389 Activity, other specified: Secondary | ICD-10-CM | POA: Insufficient documentation

## 2014-01-13 DIAGNOSIS — O9A212 Injury, poisoning and certain other consequences of external causes complicating pregnancy, second trimester: Principal | ICD-10-CM | POA: Insufficient documentation

## 2014-01-13 DIAGNOSIS — Y9289 Other specified places as the place of occurrence of the external cause: Secondary | ICD-10-CM | POA: Insufficient documentation

## 2014-01-13 DIAGNOSIS — Z8679 Personal history of other diseases of the circulatory system: Secondary | ICD-10-CM | POA: Insufficient documentation

## 2014-01-13 DIAGNOSIS — R109 Unspecified abdominal pain: Secondary | ICD-10-CM

## 2014-01-13 DIAGNOSIS — Z8619 Personal history of other infectious and parasitic diseases: Secondary | ICD-10-CM | POA: Diagnosis not present

## 2014-01-13 DIAGNOSIS — Z3A22 22 weeks gestation of pregnancy: Secondary | ICD-10-CM | POA: Diagnosis not present

## 2014-01-13 DIAGNOSIS — F17210 Nicotine dependence, cigarettes, uncomplicated: Secondary | ICD-10-CM | POA: Diagnosis present

## 2014-01-13 DIAGNOSIS — O99332 Smoking (tobacco) complicating pregnancy, second trimester: Secondary | ICD-10-CM | POA: Insufficient documentation

## 2014-01-13 DIAGNOSIS — O479 False labor, unspecified: Secondary | ICD-10-CM | POA: Diagnosis present

## 2014-01-13 DIAGNOSIS — R1030 Lower abdominal pain, unspecified: Secondary | ICD-10-CM | POA: Diagnosis present

## 2014-01-13 DIAGNOSIS — O4700 False labor before 37 completed weeks of gestation, unspecified trimester: Secondary | ICD-10-CM | POA: Diagnosis present

## 2014-01-13 DIAGNOSIS — Y998 Other external cause status: Secondary | ICD-10-CM | POA: Insufficient documentation

## 2014-01-13 DIAGNOSIS — O99512 Diseases of the respiratory system complicating pregnancy, second trimester: Secondary | ICD-10-CM | POA: Insufficient documentation

## 2014-01-13 DIAGNOSIS — Z3A24 24 weeks gestation of pregnancy: Secondary | ICD-10-CM | POA: Diagnosis present

## 2014-01-13 DIAGNOSIS — O30002 Twin pregnancy, unspecified number of placenta and unspecified number of amniotic sacs, second trimester: Secondary | ICD-10-CM | POA: Diagnosis present

## 2014-01-13 DIAGNOSIS — N760 Acute vaginitis: Secondary | ICD-10-CM | POA: Diagnosis present

## 2014-01-13 DIAGNOSIS — B9689 Other specified bacterial agents as the cause of diseases classified elsewhere: Secondary | ICD-10-CM | POA: Diagnosis present

## 2014-01-13 DIAGNOSIS — O9A219 Injury, poisoning and certain other consequences of external causes complicating pregnancy, unspecified trimester: Secondary | ICD-10-CM

## 2014-01-13 DIAGNOSIS — O23592 Infection of other part of genital tract in pregnancy, second trimester: Secondary | ICD-10-CM | POA: Diagnosis present

## 2014-01-13 LAB — CBC
HEMATOCRIT: 24.8 % — AB (ref 36.0–46.0)
HEMOGLOBIN: 8.3 g/dL — AB (ref 12.0–15.0)
MCH: 32.4 pg (ref 26.0–34.0)
MCHC: 33.5 g/dL (ref 30.0–36.0)
MCV: 96.9 fL (ref 78.0–100.0)
PLATELETS: 169 10*3/uL (ref 150–400)
RBC: 2.56 MIL/uL — AB (ref 3.87–5.11)
RDW: 15.2 % (ref 11.5–15.5)
WBC: 7.7 10*3/uL (ref 4.0–10.5)

## 2014-01-13 LAB — TYPE AND SCREEN
ABO/RH(D): A POS
Antibody Screen: NEGATIVE

## 2014-01-13 LAB — WET PREP, GENITAL
TRICH WET PREP: NONE SEEN
YEAST WET PREP: NONE SEEN

## 2014-01-13 LAB — FETAL FIBRONECTIN: Fetal Fibronectin: NEGATIVE

## 2014-01-13 MED ORDER — DOCUSATE SODIUM 100 MG PO CAPS
100.0000 mg | ORAL_CAPSULE | Freq: Every day | ORAL | Status: DC
  Filled 2014-01-13: qty 1

## 2014-01-13 MED ORDER — MORPHINE SULFATE 10 MG/ML IJ SOLN
10.0000 mg | Freq: Once | INTRAMUSCULAR | Status: DC
Start: 2014-01-13 — End: 2014-01-13
  Filled 2014-01-13: qty 1

## 2014-01-13 MED ORDER — ALBUTEROL SULFATE (2.5 MG/3ML) 0.083% IN NEBU: 2.5000 mg | INHALATION_SOLUTION | Freq: Four times a day (QID) | RESPIRATORY_TRACT | Status: DC | PRN

## 2014-01-13 MED ORDER — NICOTINE 14 MG/24HR TD PT24: 14.0000 mg | MEDICATED_PATCH | Freq: Every day | TRANSDERMAL | Status: DC

## 2014-01-13 MED ORDER — PRENATAL MULTIVITAMIN CH: 2.0000 | ORAL_TABLET | Freq: Every day | ORAL | Status: DC

## 2014-01-13 MED ORDER — PRENATAL MULTIVITAMIN CH: 1.0000 | ORAL_TABLET | Freq: Every day | ORAL | Status: DC

## 2014-01-13 MED ORDER — CALCIUM CARBONATE ANTACID 500 MG PO CHEW: 2.0000 | CHEWABLE_TABLET | ORAL | Status: DC | PRN

## 2014-01-13 MED ORDER — DOCUSATE SODIUM 100 MG PO CAPS: 100.0000 mg | ORAL_CAPSULE | Freq: Every day | ORAL | Status: DC

## 2014-01-13 MED ORDER — ZOLPIDEM TARTRATE 5 MG PO TABS: 5.0000 mg | ORAL_TABLET | Freq: Every evening | ORAL | Status: DC | PRN

## 2014-01-13 MED ORDER — ALBUTEROL SULFATE (2.5 MG/3ML) 0.083% IN NEBU: INHALATION_SOLUTION | Freq: Four times a day (QID) | RESPIRATORY_TRACT | Status: DC | PRN

## 2014-01-13 MED ORDER — FERROUS FUMARATE 325 (106 FE) MG PO TABS: 1.0000 | ORAL_TABLET | Freq: Every day | ORAL | Status: DC

## 2014-01-13 MED ORDER — ACETAMINOPHEN 325 MG PO TABS: 650.0000 mg | ORAL_TABLET | ORAL | Status: DC | PRN

## 2014-01-13 MED ORDER — OXYCODONE HCL 5 MG PO TABS: 10.0000 mg | ORAL_TABLET | ORAL | Status: DC | PRN

## 2014-01-13 MED ORDER — NICOTINE 14 MG/24HR TD PT24
14.0000 mg | MEDICATED_PATCH | Freq: Every day | TRANSDERMAL | Status: DC
  Filled 2014-01-13 (×2): qty 1

## 2014-01-13 MED ORDER — ACETAMINOPHEN 325 MG PO TABS
650.0000 mg | ORAL_TABLET | ORAL | Status: DC | PRN
  Administered 2014-01-13: 650 mg via ORAL
  Filled 2014-01-13: qty 2

## 2014-01-13 MED ORDER — ACETAMINOPHEN 500 MG PO TABS
1000.0000 mg | ORAL_TABLET | Freq: Once | ORAL | Status: AC
  Administered 2014-01-13: 1000 mg via ORAL
  Filled 2014-01-13: qty 2

## 2014-01-13 MED ORDER — OXYCODONE HCL 5 MG PO TABS: 10.0000 mg | ORAL_TABLET | Freq: Four times a day (QID) | ORAL | Status: DC | PRN

## 2014-01-13 MED ORDER — CALCIUM CARBONATE ANTACID 500 MG PO CHEW
2.0000 | CHEWABLE_TABLET | ORAL | Status: DC | PRN
  Filled 2014-01-13: qty 2

## 2014-01-13 MED ORDER — PROMETHAZINE HCL 25 MG/ML IJ SOLN
25.0000 mg | Freq: Once | INTRAMUSCULAR | Status: DC
  Filled 2014-01-13: qty 1

## 2014-01-13 MED ORDER — PRENATAL MULTIVITAMIN CH
1.0000 | ORAL_TABLET | Freq: Every day | ORAL | Status: DC
  Filled 2014-01-13: qty 1

## 2014-01-13 NOTE — Plan of Care (Signed)
Problem: Discharge Progression Outcomes Goal: Social Services Referral Outcome: Progressing Social Work consult, SW paged

## 2014-01-13 NOTE — Progress Notes (Deleted)
Clinical Social Work Department PSYCHOSOCIAL ASSESSMENT - MATERNAL/CHILD 01/13/2014  Patient:  Julia Cain,Julia Cain  Account Number:  [id number]  Admit Date:  01/10/2014  Childs Name:   Julia Cain    Clinical Social Worker:  Raybon Conard, LCSW   Date/Time:  01/13/2014 10:30 AM  Date Referred:  01/13/2014   Referral source  RN     Referred reason  Other - See comment   Other referral source:    I:  FAMILY / HOME ENVIRONMENT Child's legal guardian:  PARENT  Guardian - Name Guardian - Age Guardian - Address  Julia Cain,Julia Cain [address]  [place] [address]  Julia Cain, Jerrrey  [employer]- [employer]   Other household support members/support persons Other support:   Maternal relatives    II  PSYCHOSOCIAL DATA Information Source:    Financial and Community Resources Employment:   FOB in the [employer]   Financial resources:  Private Insurance If Medicaid - County:   Other  Food Stamps   School / Grade:   Maternity Care Coordinator / Child Services Coordination / Early Interventions:  Cultural issues impacting care:    III  STRENGTHS Strengths  Supportive family/friends  Home prepared for Child (including basic supplies)  Adequate Resources   Strength comment:    IV  RISK FACTORS AND CURRENT PROBLEMS Current Problem:       V  SOCIAL WORK ASSESSMENT Acknowledged order for CSW consult for support.  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    Met with mother who was pleasant and receptive to social work intervention. Parents are married with no other dependents.    FOB has been in the [employer] since Oct. 2014, and mother stated they got married March 2015.  Mother states that she lives in [place], but her support is in [place] and she plan to remain in [place] for about 2 to 3 weeks.   Mother states that she is excited about the baby, and would have loved for them to have experience the birth of their child together.  She is reportedly connected with a church in her  community and states that the [employer] provides a lot of support.  She denies any hx of substance abuse or mental illness.    No acute social concerns noted or reported at this time.  Good bonding noted with newborn.   Mother informed of CSW availability.      VI SOCIAL WORK PLAN Social Work Plan  No Further Intervention Required / No Barriers to Discharge    
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
## 2014-01-13 NOTE — Progress Notes (Signed)
Pt dressed and walking out by the nurses station without telling the RN anything.  RN following patient out of the the hospital, pt stating that she was leaving, pt doesn't have a ride but states that she is walking home.  Offered to attempt to get pt a bus pass, or call a family member and pt refuses all the while calling people on her cell phone.  Rn asking pt is there any thing I can do for her, pt asking "can you kill these babies"?  Discussed with pt the risks of leaving AMA, PTL, abruption, death of her infant.  Pt signing AMA form and continuing to walk out.

## 2014-01-13 NOTE — Progress Notes (Signed)
MD ordering Nicotine patch and plans to keep patient until tomorrow.  W/C rides denied at this time.
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
## 2014-01-13 NOTE — Progress Notes (Signed)
MD called to inform that patient is requesting to be discharged. Pt denies pain ir VB and states that she feels only the occasional tightening of abdomen.  MD in a delivery and will check on pt

## 2014-01-13 NOTE — Plan of Care (Signed)
Problem: Consults Goal: Birthing Suites Patient Information Press F2 to bring up selections list   Pt < [redacted] weeks EGA     
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
## 2014-01-14 ENCOUNTER — Encounter (HOSPITAL_COMMUNITY): Payer: Self-pay | Admitting: *Deleted

## 2014-01-14 DIAGNOSIS — O9A212 Injury, poisoning and certain other consequences of external causes complicating pregnancy, second trimester: Secondary | ICD-10-CM | POA: Diagnosis not present

## 2014-01-14 NOTE — Plan of Care (Signed)
Problem: Discharge Progression Outcomes Goal: Social Services Referral Outcome: Progressing In house social worker paged
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
## 2014-01-14 NOTE — Progress Notes (Signed)
MD talking to pt and will possibly discharge this afternoon after social work consult
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
## 2014-01-14 NOTE — Progress Notes (Signed)
UR completed 

## 2014-01-15 LAB — GC/CHLAMYDIA PROBE AMP
CT Probe RNA: NEGATIVE
GC Probe RNA: NEGATIVE

## 2014-01-19 ENCOUNTER — Encounter (HOSPITAL_COMMUNITY): Payer: Self-pay

## 2014-01-19 ENCOUNTER — Ambulatory Visit (HOSPITAL_COMMUNITY)
Admission: RE | Admit: 2014-01-19 | Discharge: 2014-01-19 | Disposition: A | Discharge status: Home / Self Care | Payer: Medicaid Other | Source: Ambulatory Visit | Attending: Obstetrics | Admitting: Obstetrics

## 2014-01-19 ENCOUNTER — Other Ambulatory Visit (HOSPITAL_COMMUNITY): Payer: Self-pay | Admitting: Obstetrics

## 2014-01-19 DIAGNOSIS — Z3A25 25 weeks gestation of pregnancy: Secondary | ICD-10-CM | POA: Insufficient documentation

## 2014-01-19 DIAGNOSIS — O30042 Twin pregnancy, dichorionic/diamniotic, second trimester: Secondary | ICD-10-CM | POA: Diagnosis not present

## 2014-01-19 DIAGNOSIS — O358XX0 Maternal care for other (suspected) fetal abnormality and damage, not applicable or unspecified: Secondary | ICD-10-CM | POA: Diagnosis present

## 2014-01-19 DIAGNOSIS — O09292 Supervision of pregnancy with other poor reproductive or obstetric history, second trimester: Secondary | ICD-10-CM | POA: Diagnosis not present

## 2014-01-19 DIAGNOSIS — IMO0001 Reserved for inherently not codable concepts without codable children: Secondary | ICD-10-CM

## 2014-01-19 DIAGNOSIS — B343 Parvovirus infection, unspecified: Secondary | ICD-10-CM

## 2014-01-19 DIAGNOSIS — O358XX2 Maternal care for other (suspected) fetal abnormality and damage, fetus 2: Secondary | ICD-10-CM | POA: Insufficient documentation

## 2014-01-19 DIAGNOSIS — O98512 Other viral diseases complicating pregnancy, second trimester: Secondary | ICD-10-CM

## 2014-01-24 ENCOUNTER — Other Ambulatory Visit (HOSPITAL_COMMUNITY): Payer: Self-pay

## 2014-01-25 ENCOUNTER — Ambulatory Visit (HOSPITAL_COMMUNITY): Payer: Medicaid Other

## 2014-02-01 ENCOUNTER — Ambulatory Visit (HOSPITAL_COMMUNITY)
Admission: RE | Admit: 2014-02-01 | Discharge: 2014-02-01 | Disposition: A | Discharge status: Home / Self Care | Payer: Medicaid Other | Source: Ambulatory Visit | Attending: Obstetrics | Admitting: Obstetrics

## 2014-02-01 ENCOUNTER — Encounter (HOSPITAL_COMMUNITY): Payer: Self-pay

## 2014-02-01 DIAGNOSIS — O30049 Twin pregnancy, dichorionic/diamniotic, unspecified trimester: Secondary | ICD-10-CM

## 2014-02-01 DIAGNOSIS — O98512 Other viral diseases complicating pregnancy, second trimester: Secondary | ICD-10-CM | POA: Insufficient documentation

## 2014-02-01 DIAGNOSIS — O30042 Twin pregnancy, dichorionic/diamniotic, second trimester: Secondary | ICD-10-CM | POA: Insufficient documentation

## 2014-02-01 DIAGNOSIS — Z3A27 27 weeks gestation of pregnancy: Secondary | ICD-10-CM | POA: Diagnosis not present

## 2014-02-01 DIAGNOSIS — O09299 Supervision of pregnancy with other poor reproductive or obstetric history, unspecified trimester: Secondary | ICD-10-CM | POA: Insufficient documentation

## 2014-02-01 DIAGNOSIS — B199 Unspecified viral hepatitis without hepatic coma: Secondary | ICD-10-CM | POA: Insufficient documentation

## 2014-02-01 DIAGNOSIS — Z8759 Personal history of other complications of pregnancy, childbirth and the puerperium: Secondary | ICD-10-CM | POA: Insufficient documentation

## 2014-02-01 DIAGNOSIS — O98519 Other viral diseases complicating pregnancy, unspecified trimester: Secondary | ICD-10-CM | POA: Diagnosis present

## 2014-02-01 DIAGNOSIS — O359XX2 Maternal care for (suspected) fetal abnormality and damage, unspecified, fetus 2: Secondary | ICD-10-CM | POA: Diagnosis not present

## 2014-02-01 DIAGNOSIS — B343 Parvovirus infection, unspecified: Secondary | ICD-10-CM

## 2014-02-01 DIAGNOSIS — IMO0002 Reserved for concepts with insufficient information to code with codable children: Secondary | ICD-10-CM

## 2014-02-01 DIAGNOSIS — IMO0001 Reserved for inherently not codable concepts without codable children: Secondary | ICD-10-CM

## 2014-02-01 DIAGNOSIS — O359XX0 Maternal care for (suspected) fetal abnormality and damage, unspecified, not applicable or unspecified: Secondary | ICD-10-CM | POA: Diagnosis present

## 2014-02-02 NOTE — L&D Delivery Note (Signed)
  Julia Cain, Boy [name] [[id number]][[id number]]  Delivery Note At 5:35 PM a viable female was delivered via Vaginal, Spontaneous Delivery (Presentation: ; Occiput Anterior).  APGAR: 9, 9; weight  .   Placenta status: Intact, Spontaneous.  Cord: 3 vessels with the following complications: None.  Cord pH: not done  Anesthesia: Epidural  Episiotomy: None Lacerations: None Suture Repair: 2.0 Est. Blood Loss (mL): 250  Mom to postpartum.  Baby to Couplet care / Skin to Skin.  MARSHALL,BERNARD A 04/13/2014, 6:16 PM     Carlyle [name], [name] [[id number]][[id number]]  Delivery Note At 6:00 PM a viable female was delivered via  (Presentation: ;  ).  APGAR: , ; weight  .   Placenta status: , .  Cord:  with the following complications: .  Cord pH: not done  Anesthesia:   Episiotomy:   Lacerations:   Suture Repair: 2.0 Est. Blood Loss (mL): 250  Mom to postpartum.  Baby to Couplet care / Skin to Skin.  MARSHALL,BERNARD A 04/13/2014, 6:16 PM

## 2014-02-03 ENCOUNTER — Emergency Department (HOSPITAL_COMMUNITY)
Admission: EM | Admit: 2014-02-03 | Discharge: 2014-02-04 | Disposition: A | Discharge status: Home / Self Care | Payer: Medicaid Other | Attending: Emergency Medicine | Admitting: Emergency Medicine

## 2014-02-03 ENCOUNTER — Encounter (HOSPITAL_COMMUNITY): Payer: Self-pay | Admitting: Emergency Medicine

## 2014-02-03 DIAGNOSIS — O9A212 Injury, poisoning and certain other consequences of external causes complicating pregnancy, second trimester: Secondary | ICD-10-CM | POA: Insufficient documentation

## 2014-02-03 DIAGNOSIS — Z792 Long term (current) use of antibiotics: Secondary | ICD-10-CM | POA: Insufficient documentation

## 2014-02-03 DIAGNOSIS — Z3A27 27 weeks gestation of pregnancy: Secondary | ICD-10-CM | POA: Diagnosis not present

## 2014-02-03 DIAGNOSIS — Z8619 Personal history of other infectious and parasitic diseases: Secondary | ICD-10-CM | POA: Insufficient documentation

## 2014-02-03 DIAGNOSIS — O99512 Diseases of the respiratory system complicating pregnancy, second trimester: Secondary | ICD-10-CM | POA: Diagnosis not present

## 2014-02-03 DIAGNOSIS — S00211A Abrasion of right eyelid and periocular area, initial encounter: Secondary | ICD-10-CM | POA: Diagnosis not present

## 2014-02-03 DIAGNOSIS — Y9289 Other specified places as the place of occurrence of the external cause: Secondary | ICD-10-CM | POA: Diagnosis not present

## 2014-02-03 DIAGNOSIS — J45909 Unspecified asthma, uncomplicated: Secondary | ICD-10-CM | POA: Diagnosis not present

## 2014-02-03 DIAGNOSIS — O365920 Maternal care for other known or suspected poor fetal growth, second trimester, not applicable or unspecified: Secondary | ICD-10-CM | POA: Insufficient documentation

## 2014-02-03 DIAGNOSIS — Y9389 Activity, other specified: Secondary | ICD-10-CM | POA: Diagnosis not present

## 2014-02-03 DIAGNOSIS — S0081XA Abrasion of other part of head, initial encounter: Secondary | ICD-10-CM

## 2014-02-03 DIAGNOSIS — Z8679 Personal history of other diseases of the circulatory system: Secondary | ICD-10-CM | POA: Insufficient documentation

## 2014-02-03 DIAGNOSIS — Y998 Other external cause status: Secondary | ICD-10-CM | POA: Diagnosis not present

## 2014-02-03 DIAGNOSIS — Z79899 Other long term (current) drug therapy: Secondary | ICD-10-CM | POA: Diagnosis not present

## 2014-02-03 DIAGNOSIS — Z720 Tobacco use: Secondary | ICD-10-CM | POA: Diagnosis not present

## 2014-02-03 MED ORDER — ACETAMINOPHEN 325 MG PO TABS
650.0000 mg | ORAL_TABLET | Freq: Once | ORAL | Status: AC
  Administered 2014-02-04: 650 mg via ORAL
  Filled 2014-02-03: qty 2
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
## 2014-02-04 NOTE — Discharge Instructions (Signed)
Abrasions An abrasion is a cut or scrape of the skin. Abrasions do not go through all layers of the skin. HOME CARE  If a bandage (dressing) was put on your wound, change it as told by your doctor. If the bandage sticks, soak it off with warm.  Wash the area with water and soap 2 times a day. Rinse off the soap. Pat the area dry with a clean towel.  Put on medicated cream (ointment) as told by your doctor.  Change your bandage right away if it gets wet or dirty.  Only take medicine as told by your doctor.  See your doctor within 24-48 hours to get your wound checked.  Check your wound for redness, puffiness (swelling), or yellowish-white fluid (pus). GET HELP RIGHT AWAY IF:   You have more pain in the wound.  You have redness, swelling, or tenderness around the wound.  You have pus coming from the wound.  You have a fever or lasting symptoms for more than 2-3 days.  You have a fever and your symptoms suddenly get worse.  You have a bad smell coming from the wound or bandage. MAKE SURE YOU:   Understand these instructions.  Will watch your condition.  Will get help right away if you are not doing well or get worse. Document Released: 07/08/2007 Document Revised: 10/14/2011 Document Reviewed: 12/23/2010 [facility] Patient Information 2015 [place], [place]. This information is not intended to replace advice given to you by your health care provider. Make sure you discuss any questions you have with your health care provider. Return at any time you are concerned about your pregnancy[facility]  Tonight the fetal heart tone were strong and regular

## 2014-02-05 ENCOUNTER — Other Ambulatory Visit (HOSPITAL_COMMUNITY): Payer: Self-pay | Admitting: Obstetrics

## 2014-02-05 ENCOUNTER — Other Ambulatory Visit (HOSPITAL_COMMUNITY): Payer: Self-pay | Admitting: Maternal and Fetal Medicine

## 2014-02-05 DIAGNOSIS — O98512 Other viral diseases complicating pregnancy, second trimester: Secondary | ICD-10-CM

## 2014-02-05 DIAGNOSIS — O30049 Twin pregnancy, dichorionic/diamniotic, unspecified trimester: Secondary | ICD-10-CM

## 2014-02-05 DIAGNOSIS — IMO0002 Reserved for concepts with insufficient information to code with codable children: Secondary | ICD-10-CM

## 2014-02-05 DIAGNOSIS — B343 Parvovirus infection, unspecified: Secondary | ICD-10-CM

## 2014-02-09 ENCOUNTER — Other Ambulatory Visit (HOSPITAL_COMMUNITY): Payer: Self-pay | Admitting: Maternal and Fetal Medicine

## 2014-02-09 ENCOUNTER — Ambulatory Visit (HOSPITAL_COMMUNITY)
Admission: RE | Admit: 2014-02-09 | Discharge: 2014-02-09 | Disposition: A | Discharge status: Home / Self Care | Payer: Medicaid Other | Source: Ambulatory Visit | Attending: Obstetrics | Admitting: Obstetrics

## 2014-02-09 DIAGNOSIS — O30049 Twin pregnancy, dichorionic/diamniotic, unspecified trimester: Secondary | ICD-10-CM

## 2014-02-09 DIAGNOSIS — B343 Parvovirus infection, unspecified: Secondary | ICD-10-CM

## 2014-02-09 DIAGNOSIS — O30043 Twin pregnancy, dichorionic/diamniotic, third trimester: Secondary | ICD-10-CM | POA: Diagnosis not present

## 2014-02-09 DIAGNOSIS — O09293 Supervision of pregnancy with other poor reproductive or obstetric history, third trimester: Secondary | ICD-10-CM | POA: Insufficient documentation

## 2014-02-09 DIAGNOSIS — O98512 Other viral diseases complicating pregnancy, second trimester: Secondary | ICD-10-CM

## 2014-02-09 DIAGNOSIS — Z3A28 28 weeks gestation of pregnancy: Secondary | ICD-10-CM | POA: Insufficient documentation

## 2014-02-09 DIAGNOSIS — IMO0002 Reserved for concepts with insufficient information to code with codable children: Secondary | ICD-10-CM

## 2014-02-09 DIAGNOSIS — O358XX0 Maternal care for other (suspected) fetal abnormality and damage, not applicable or unspecified: Secondary | ICD-10-CM | POA: Diagnosis present

## 2014-02-16 ENCOUNTER — Other Ambulatory Visit (HOSPITAL_COMMUNITY): Payer: Self-pay | Admitting: Maternal and Fetal Medicine

## 2014-02-16 ENCOUNTER — Encounter (HOSPITAL_COMMUNITY): Payer: Self-pay

## 2014-02-16 ENCOUNTER — Ambulatory Visit (HOSPITAL_COMMUNITY)
Admission: RE | Admit: 2014-02-16 | Discharge: 2014-02-16 | Disposition: A | Discharge status: Home / Self Care | Payer: Medicaid Other | Source: Ambulatory Visit | Attending: Obstetrics | Admitting: Obstetrics

## 2014-02-16 DIAGNOSIS — B343 Parvovirus infection, unspecified: Secondary | ICD-10-CM

## 2014-02-16 DIAGNOSIS — O358XX0 Maternal care for other (suspected) fetal abnormality and damage, not applicable or unspecified: Secondary | ICD-10-CM | POA: Diagnosis not present

## 2014-02-16 DIAGNOSIS — O98512 Other viral diseases complicating pregnancy, second trimester: Secondary | ICD-10-CM

## 2014-02-16 DIAGNOSIS — O30049 Twin pregnancy, dichorionic/diamniotic, unspecified trimester: Secondary | ICD-10-CM

## 2014-02-16 DIAGNOSIS — O30043 Twin pregnancy, dichorionic/diamniotic, third trimester: Secondary | ICD-10-CM | POA: Insufficient documentation

## 2014-02-16 DIAGNOSIS — IMO0002 Reserved for concepts with insufficient information to code with codable children: Secondary | ICD-10-CM

## 2014-02-16 DIAGNOSIS — O09293 Supervision of pregnancy with other poor reproductive or obstetric history, third trimester: Secondary | ICD-10-CM | POA: Insufficient documentation

## 2014-02-16 DIAGNOSIS — Z3A29 29 weeks gestation of pregnancy: Secondary | ICD-10-CM | POA: Diagnosis not present

## 2014-02-22 ENCOUNTER — Encounter (HOSPITAL_COMMUNITY): Payer: Self-pay

## 2014-02-22 ENCOUNTER — Ambulatory Visit (HOSPITAL_COMMUNITY)
Admission: RE | Admit: 2014-02-22 | Discharge: 2014-02-22 | Disposition: A | Discharge status: Home / Self Care | Payer: Medicaid Other | Source: Ambulatory Visit | Attending: Obstetrics | Admitting: Obstetrics

## 2014-02-22 DIAGNOSIS — O30049 Twin pregnancy, dichorionic/diamniotic, unspecified trimester: Secondary | ICD-10-CM

## 2014-02-22 DIAGNOSIS — O358XX2 Maternal care for other (suspected) fetal abnormality and damage, fetus 2: Secondary | ICD-10-CM | POA: Insufficient documentation

## 2014-02-22 DIAGNOSIS — O30043 Twin pregnancy, dichorionic/diamniotic, third trimester: Secondary | ICD-10-CM | POA: Diagnosis not present

## 2014-02-22 DIAGNOSIS — O98512 Other viral diseases complicating pregnancy, second trimester: Secondary | ICD-10-CM

## 2014-02-22 DIAGNOSIS — B343 Parvovirus infection, unspecified: Secondary | ICD-10-CM

## 2014-02-22 DIAGNOSIS — Z3A30 30 weeks gestation of pregnancy: Secondary | ICD-10-CM | POA: Insufficient documentation

## 2014-02-22 DIAGNOSIS — IMO0002 Reserved for concepts with insufficient information to code with codable children: Secondary | ICD-10-CM

## 2014-02-22 DIAGNOSIS — O09293 Supervision of pregnancy with other poor reproductive or obstetric history, third trimester: Secondary | ICD-10-CM | POA: Diagnosis not present

## 2014-02-22 DIAGNOSIS — O358XX0 Maternal care for other (suspected) fetal abnormality and damage, not applicable or unspecified: Secondary | ICD-10-CM | POA: Diagnosis present

## 2014-02-23 ENCOUNTER — Ambulatory Visit (HOSPITAL_COMMUNITY): Payer: Medicaid Other

## 2014-03-02 ENCOUNTER — Ambulatory Visit (HOSPITAL_COMMUNITY): Admission: RE | Admit: 2014-03-02 | Payer: Medicaid Other | Source: Ambulatory Visit

## 2014-03-05 ENCOUNTER — Inpatient Hospital Stay (HOSPITAL_COMMUNITY)
Admission: AD | Admit: 2014-03-05 | Discharge: 2014-03-05 | Disposition: A | Discharge status: Home / Self Care | Payer: Medicaid Other | Source: Ambulatory Visit | Attending: Obstetrics | Admitting: Obstetrics

## 2014-03-05 ENCOUNTER — Ambulatory Visit (HOSPITAL_COMMUNITY)
Admission: RE | Admit: 2014-03-05 | Discharge: 2014-03-05 | Disposition: A | Discharge status: Home / Self Care | Payer: Medicaid Other | Source: Ambulatory Visit | Attending: Obstetrics | Admitting: Obstetrics

## 2014-03-05 ENCOUNTER — Encounter (HOSPITAL_COMMUNITY): Payer: Self-pay | Admitting: General Practice

## 2014-03-05 ENCOUNTER — Encounter (HOSPITAL_COMMUNITY): Payer: Self-pay

## 2014-03-05 ENCOUNTER — Other Ambulatory Visit (HOSPITAL_COMMUNITY): Payer: Self-pay | Admitting: Maternal and Fetal Medicine

## 2014-03-05 DIAGNOSIS — Z3A31 31 weeks gestation of pregnancy: Secondary | ICD-10-CM | POA: Insufficient documentation

## 2014-03-05 DIAGNOSIS — O09293 Supervision of pregnancy with other poor reproductive or obstetric history, third trimester: Secondary | ICD-10-CM | POA: Diagnosis not present

## 2014-03-05 DIAGNOSIS — O358XX2 Maternal care for other (suspected) fetal abnormality and damage, fetus 2: Secondary | ICD-10-CM | POA: Insufficient documentation

## 2014-03-05 DIAGNOSIS — B343 Parvovirus infection, unspecified: Secondary | ICD-10-CM

## 2014-03-05 DIAGNOSIS — O30003 Twin pregnancy, unspecified number of placenta and unspecified number of amniotic sacs, third trimester: Secondary | ICD-10-CM | POA: Insufficient documentation

## 2014-03-05 DIAGNOSIS — O98512 Other viral diseases complicating pregnancy, second trimester: Secondary | ICD-10-CM

## 2014-03-05 DIAGNOSIS — O30049 Twin pregnancy, dichorionic/diamniotic, unspecified trimester: Secondary | ICD-10-CM

## 2014-03-05 DIAGNOSIS — O26893 Other specified pregnancy related conditions, third trimester: Secondary | ICD-10-CM

## 2014-03-05 DIAGNOSIS — O9989 Other specified diseases and conditions complicating pregnancy, childbirth and the puerperium: Secondary | ICD-10-CM | POA: Insufficient documentation

## 2014-03-05 DIAGNOSIS — IMO0002 Reserved for concepts with insufficient information to code with codable children: Secondary | ICD-10-CM | POA: Insufficient documentation

## 2014-03-05 DIAGNOSIS — O30043 Twin pregnancy, dichorionic/diamniotic, third trimester: Secondary | ICD-10-CM | POA: Diagnosis not present

## 2014-03-05 DIAGNOSIS — R109 Unspecified abdominal pain: Secondary | ICD-10-CM | POA: Diagnosis not present

## 2014-03-05 DIAGNOSIS — N949 Unspecified condition associated with female genital organs and menstrual cycle: Secondary | ICD-10-CM | POA: Diagnosis not present

## 2014-03-05 DIAGNOSIS — N898 Other specified noninflammatory disorders of vagina: Secondary | ICD-10-CM

## 2014-03-05 DIAGNOSIS — O358XX0 Maternal care for other (suspected) fetal abnormality and damage, not applicable or unspecified: Secondary | ICD-10-CM | POA: Diagnosis present

## 2014-03-05 LAB — AMNISURE RUPTURE OF MEMBRANE (ROM) NOT AT ARMC: Amnisure ROM: NEGATIVE

## 2014-03-05 LAB — WET PREP, GENITAL
Clue Cells Wet Prep HPF POC: NONE SEEN
Trich, Wet Prep: NONE SEEN

## 2014-03-05 NOTE — MAU Provider Note (Signed)
History     CSN: [id number]  Arrival date and time: 03/05/14 1303   First Provider Initiated Contact with Patient 03/05/14 1416      Chief Complaint  Patient presents with  . Vaginal Discharge   HPI   Julia Cain is a 24 y.o. female [id number] at 5447w4d twin gestation who presents with possible leaking of fluid.  She started leaking fluid last Wednesday or Thursday. The fluid is clear; no odor. She is not having to wear a pad; she just notices it in her underwear. " I just feel more wet".   She also voices concern for STI's and states that she wants to be tested for everything.   + fetal movement of both fetuses  Denies vaginal bleeding   OB History    Gravida Para Term Preterm AB TAB SAB Ectopic Multiple Living   5 1 1  3 1 2  1 2       Past Medical History  Diagnosis Date  . No pertinent past medical history   . PIH (pregnancy induced hypertension)   . Asthma   . Pregnancy induced hypertension   . Trichomonas   . Miscarriage within last 12 months     Past Surgical History  Procedure Laterality Date  . Dilation and curettage of uterus    . Induced abortion      Family History  Problem Relation Age of Onset  . Hypertension Mother   . Cancer Paternal Uncle   . Asthma Sister     History  Substance Use Topics  . Smoking status: Current Every Day Smoker -- 0.25 packs/day    Types: Cigarettes  . Smokeless tobacco: Never Used  . Alcohol Use: No     Comment: 1-2 blunts a day, usually drinks a bottle of Brandy or some other kink of liquor a week.    Allergies:  Allergies  Allergen Reactions  . Shellfish Allergy Itching, Swelling and Rash    Prescriptions prior to admission  Medication Sig Dispense Refill Last Dose  . albuterol (PROVENTIL HFA;VENTOLIN HFA) 108 (90 BASE) MCG/ACT inhaler Inhale 2 puffs into the lungs every 6 (six) hours as needed for wheezing or shortness of breath.   Taking  . amoxicillin (AMOXIL) 500 MG capsule Take 2 capsules (1,000  mg total) by mouth 2 (two) times daily. (Patient not taking: Reported on 01/05/2014) 40 capsule 0 Not Taking  . ferrous fumarate (HEMOCYTE - 106 MG FE) 325 (106 FE) MG TABS tablet Take 1 tablet by mouth daily.   Taking  . metroNIDAZOLE (FLAGYL) 500 MG tablet Take 1 tablet (500 mg total) by mouth 2 (two) times daily. (Patient not taking: Reported on 01/05/2014) 14 tablet 0 Not Taking  . nitrofurantoin, macrocrystal-monohydrate, (MACROBID) 100 MG capsule Take 1 capsule (100 mg total) by mouth 2 (two) times daily. (Patient not taking: Reported on 01/05/2014) 14 capsule 0 Not Taking  . Prenatal Vit-Fe Fumarate-FA (PRENATAL MULTIVITAMIN) TABS tablet Take 2 tablets by mouth daily.    Taking    No results found for this or any previous visit (from the past 48 hour(s)).   Results for orders placed or performed during the hospital encounter of 03/05/14 (from the past 72 hour(s))  GC/Chlamydia probe amp ([place])     Status: None   Collection Time: 03/05/14 12:00 AM  Result Value Ref Range   Chlamydia CT: Negative     Comment: Normal Reference Range - Negative   Neisseria gonorrhea NG: Negative  Comment: Normal Reference Range - Negative  Amnisure rupture of membrane (rom)     Status: None   Collection Time: 03/05/14  2:25 PM  Result Value Ref Range   Amnisure ROM NEGATIVE   Wet prep, genital     Status: Abnormal   Collection Time: 03/05/14  2:25 PM  Result Value Ref Range   Yeast Wet Prep HPF POC FEW (A) NONE SEEN   Trich, Wet Prep NONE SEEN NONE SEEN   Clue Cells Wet Prep HPF POC NONE SEEN NONE SEEN   WBC, Wet Prep HPF POC FEW (A) NONE SEEN    Comment: MANY BACTERIA SEEN    Review of Systems  Constitutional: Negative for fever and chills.  Gastrointestinal: Negative for nausea, vomiting, abdominal pain, diarrhea and constipation.  Genitourinary: Negative for dysuria, urgency and frequency.  Musculoskeletal: Positive for back pain.   Physical Exam   Blood pressure 108/70, pulse 83,  temperature 98.3 F (36.8 C), temperature source Oral, resp. rate 16, height  (1.575 m), weight 61.236 kg (135 lb), last menstrual period 07/27/2013.  Physical Exam  Constitutional: She is oriented to person, place, and time. She appears well-developed and well-nourished. No distress.  HENT:  Head: Normocephalic.  Eyes: Pupils are equal, round, and reactive to light.  Neck: Neck supple.  Cardiovascular: Normal rate and normal heart sounds.   Respiratory: Effort normal and breath sounds normal.  GI: Soft. She exhibits no distension. There is no tenderness. There is no rebound and no guarding.  Genitourinary:  Speculum exam: Vagina - Small amount of creamy, yellow discharge, no odor. No pooling of fluid  Cervix - No contact bleeding, no active bleeding Bimanual exam: Dilation: Closed Effacement (%): Thick Cervical Position: Posterior Exam by:: Julia Carbon, NP GC/Chlam, wet prep done Chaperone present for exam.   Musculoskeletal: Normal range of motion.  Neurological: She is alert and oriented to person, place, and time.  Skin: Skin is warm. She is not diaphoretic.  Psychiatric: Her behavior is normal.      Fetal Tracing: A Baseline: 130 bpm  Variability: Moderate  Accelerations: 10x10 Decelerations: none Toco: none  Fetal Tracing: B Baseline: 125 bpm  Variability: Moderate  Accelerations: 15x15 Decelerations: none Toco: none It was difficult to trace NST due to patient unable to stay still in the bed. The patient was on her cell phone having a very loud conversation and crying most of the time in MAU.   MAU Course  Procedures  None  MDM Wet prep Amnisure  GC   Patient had to leave to pick up her kids. I informed the patient that her amnisure is negative and that I would call her if anything came up positive on her wet prep. Patient agreed with plan of care. States she cannot stay for any further testing at this time.   Assessment and Plan   A:  1.  Vaginal discharge in pregnancy, third trimester    P:  Discharge home in stable condition Kick counts Return to MAU as needed, if symptoms worsen Preterm labor precautions Follow up with OB as needed, as scheduled   Julia Hansen Rasch, NP 03/07/2014 9:18 AM

## 2014-03-05 NOTE — ED Notes (Signed)
Pt reports leaking watery fluid since 03/01/14.

## 2014-03-05 NOTE — MAU Note (Signed)
Pt presents from the office for possible ROM. First noticed leaking on Wednesday of this week and has continued to leak. Not wearing pad. Denies vaginal bleeding. Reports pain in vagina and abdomen. Reports good fetal movement. Twin pregnancy.

## 2014-03-05 NOTE — MAU Note (Signed)
Patient not in lobby

## 2014-03-06 LAB — GC/CHLAMYDIA PROBE AMP (~~LOC~~) NOT AT ARMC
CHLAMYDIA, DNA PROBE: NEGATIVE
Neisseria Gonorrhea: NEGATIVE

## 2014-03-09 ENCOUNTER — Ambulatory Visit (HOSPITAL_COMMUNITY): Admission: RE | Admit: 2014-03-09 | Payer: Medicaid Other | Source: Ambulatory Visit

## 2014-03-16 ENCOUNTER — Ambulatory Visit (HOSPITAL_COMMUNITY)
Admission: RE | Admit: 2014-03-16 | Discharge: 2014-03-16 | Disposition: A | Discharge status: Home / Self Care | Payer: Medicaid Other | Source: Ambulatory Visit | Attending: Obstetrics | Admitting: Obstetrics

## 2014-03-16 ENCOUNTER — Encounter (HOSPITAL_COMMUNITY): Payer: Self-pay

## 2014-03-16 DIAGNOSIS — IMO0002 Reserved for concepts with insufficient information to code with codable children: Secondary | ICD-10-CM

## 2014-03-16 DIAGNOSIS — O09293 Supervision of pregnancy with other poor reproductive or obstetric history, third trimester: Secondary | ICD-10-CM | POA: Diagnosis not present

## 2014-03-16 DIAGNOSIS — O358XX2 Maternal care for other (suspected) fetal abnormality and damage, fetus 2: Secondary | ICD-10-CM | POA: Insufficient documentation

## 2014-03-16 DIAGNOSIS — B343 Parvovirus infection, unspecified: Secondary | ICD-10-CM

## 2014-03-16 DIAGNOSIS — O30043 Twin pregnancy, dichorionic/diamniotic, third trimester: Secondary | ICD-10-CM | POA: Diagnosis not present

## 2014-03-16 DIAGNOSIS — Z3A33 33 weeks gestation of pregnancy: Secondary | ICD-10-CM | POA: Insufficient documentation

## 2014-03-16 DIAGNOSIS — O30049 Twin pregnancy, dichorionic/diamniotic, unspecified trimester: Secondary | ICD-10-CM

## 2014-03-16 DIAGNOSIS — O98512 Other viral diseases complicating pregnancy, second trimester: Secondary | ICD-10-CM

## 2014-03-16 DIAGNOSIS — Z36 Encounter for antenatal screening of mother: Secondary | ICD-10-CM | POA: Insufficient documentation

## 2014-03-23 ENCOUNTER — Other Ambulatory Visit (HOSPITAL_COMMUNITY): Payer: Self-pay | Admitting: Maternal and Fetal Medicine

## 2014-03-23 ENCOUNTER — Encounter (HOSPITAL_COMMUNITY): Payer: Self-pay

## 2014-03-23 ENCOUNTER — Ambulatory Visit (HOSPITAL_COMMUNITY)
Admission: RE | Admit: 2014-03-23 | Discharge: 2014-03-23 | Disposition: A | Discharge status: Home / Self Care | Payer: Medicaid Other | Source: Ambulatory Visit | Attending: Obstetrics | Admitting: Obstetrics

## 2014-03-23 DIAGNOSIS — B343 Parvovirus infection, unspecified: Secondary | ICD-10-CM

## 2014-03-23 DIAGNOSIS — O09293 Supervision of pregnancy with other poor reproductive or obstetric history, third trimester: Secondary | ICD-10-CM | POA: Insufficient documentation

## 2014-03-23 DIAGNOSIS — O30043 Twin pregnancy, dichorionic/diamniotic, third trimester: Secondary | ICD-10-CM | POA: Insufficient documentation

## 2014-03-23 DIAGNOSIS — IMO0002 Reserved for concepts with insufficient information to code with codable children: Secondary | ICD-10-CM

## 2014-03-23 DIAGNOSIS — O30049 Twin pregnancy, dichorionic/diamniotic, unspecified trimester: Secondary | ICD-10-CM

## 2014-03-23 DIAGNOSIS — O98512 Other viral diseases complicating pregnancy, second trimester: Secondary | ICD-10-CM

## 2014-03-23 DIAGNOSIS — O358XX2 Maternal care for other (suspected) fetal abnormality and damage, fetus 2: Secondary | ICD-10-CM | POA: Insufficient documentation

## 2014-03-23 DIAGNOSIS — O358XX0 Maternal care for other (suspected) fetal abnormality and damage, not applicable or unspecified: Secondary | ICD-10-CM | POA: Diagnosis present

## 2014-03-23 DIAGNOSIS — O365930 Maternal care for other known or suspected poor fetal growth, third trimester, not applicable or unspecified: Secondary | ICD-10-CM | POA: Insufficient documentation

## 2014-03-23 DIAGNOSIS — Z3A34 34 weeks gestation of pregnancy: Secondary | ICD-10-CM | POA: Insufficient documentation

## 2014-03-30 ENCOUNTER — Other Ambulatory Visit (HOSPITAL_COMMUNITY): Payer: Self-pay | Admitting: Maternal and Fetal Medicine

## 2014-03-30 ENCOUNTER — Ambulatory Visit (HOSPITAL_COMMUNITY)
Admission: RE | Admit: 2014-03-30 | Discharge: 2014-03-30 | Disposition: A | Discharge status: Home / Self Care | Payer: Medicaid Other | Source: Ambulatory Visit | Attending: Obstetrics | Admitting: Obstetrics

## 2014-03-30 DIAGNOSIS — O30049 Twin pregnancy, dichorionic/diamniotic, unspecified trimester: Secondary | ICD-10-CM

## 2014-03-30 DIAGNOSIS — O98512 Other viral diseases complicating pregnancy, second trimester: Secondary | ICD-10-CM

## 2014-03-30 DIAGNOSIS — IMO0002 Reserved for concepts with insufficient information to code with codable children: Secondary | ICD-10-CM

## 2014-03-30 DIAGNOSIS — O358XX0 Maternal care for other (suspected) fetal abnormality and damage, not applicable or unspecified: Secondary | ICD-10-CM | POA: Diagnosis not present

## 2014-03-30 DIAGNOSIS — B343 Parvovirus infection, unspecified: Secondary | ICD-10-CM

## 2014-03-30 DIAGNOSIS — O30043 Twin pregnancy, dichorionic/diamniotic, third trimester: Secondary | ICD-10-CM | POA: Insufficient documentation

## 2014-03-30 DIAGNOSIS — Z3A35 35 weeks gestation of pregnancy: Secondary | ICD-10-CM | POA: Insufficient documentation

## 2014-03-30 DIAGNOSIS — O09293 Supervision of pregnancy with other poor reproductive or obstetric history, third trimester: Secondary | ICD-10-CM | POA: Insufficient documentation

## 2014-04-03 ENCOUNTER — Other Ambulatory Visit (HOSPITAL_COMMUNITY): Payer: Self-pay | Admitting: Maternal and Fetal Medicine

## 2014-04-03 DIAGNOSIS — B343 Parvovirus infection, unspecified: Secondary | ICD-10-CM

## 2014-04-03 DIAGNOSIS — O30049 Twin pregnancy, dichorionic/diamniotic, unspecified trimester: Secondary | ICD-10-CM

## 2014-04-03 DIAGNOSIS — O98512 Other viral diseases complicating pregnancy, second trimester: Secondary | ICD-10-CM

## 2014-04-03 DIAGNOSIS — IMO0002 Reserved for concepts with insufficient information to code with codable children: Secondary | ICD-10-CM

## 2014-04-06 ENCOUNTER — Ambulatory Visit (HOSPITAL_COMMUNITY): Admission: RE | Admit: 2014-04-06 | Payer: Medicaid Other | Source: Ambulatory Visit

## 2014-04-09 ENCOUNTER — Ambulatory Visit (HOSPITAL_COMMUNITY): Payer: Medicaid Other

## 2014-04-10 ENCOUNTER — Other Ambulatory Visit (HOSPITAL_COMMUNITY): Payer: Self-pay | Admitting: Maternal and Fetal Medicine

## 2014-04-10 ENCOUNTER — Ambulatory Visit (HOSPITAL_COMMUNITY)
Admission: RE | Admit: 2014-04-10 | Discharge: 2014-04-10 | Disposition: A | Discharge status: Home / Self Care | Payer: Medicaid Other | Source: Ambulatory Visit | Attending: Obstetrics | Admitting: Obstetrics

## 2014-04-10 DIAGNOSIS — O358XX2 Maternal care for other (suspected) fetal abnormality and damage, fetus 2: Secondary | ICD-10-CM | POA: Insufficient documentation

## 2014-04-10 DIAGNOSIS — O30049 Twin pregnancy, dichorionic/diamniotic, unspecified trimester: Secondary | ICD-10-CM

## 2014-04-10 DIAGNOSIS — O09293 Supervision of pregnancy with other poor reproductive or obstetric history, third trimester: Secondary | ICD-10-CM | POA: Diagnosis not present

## 2014-04-10 DIAGNOSIS — B343 Parvovirus infection, unspecified: Secondary | ICD-10-CM

## 2014-04-10 DIAGNOSIS — O30043 Twin pregnancy, dichorionic/diamniotic, third trimester: Secondary | ICD-10-CM | POA: Insufficient documentation

## 2014-04-10 DIAGNOSIS — IMO0002 Reserved for concepts with insufficient information to code with codable children: Secondary | ICD-10-CM

## 2014-04-10 DIAGNOSIS — Z3A36 36 weeks gestation of pregnancy: Secondary | ICD-10-CM | POA: Diagnosis not present

## 2014-04-10 DIAGNOSIS — O98512 Other viral diseases complicating pregnancy, second trimester: Secondary | ICD-10-CM

## 2014-04-10 DIAGNOSIS — O365931 Maternal care for other known or suspected poor fetal growth, third trimester, fetus 1: Secondary | ICD-10-CM | POA: Insufficient documentation

## 2014-04-12 ENCOUNTER — Encounter (HOSPITAL_COMMUNITY): Payer: Self-pay | Admitting: *Deleted

## 2014-04-12 ENCOUNTER — Inpatient Hospital Stay (HOSPITAL_COMMUNITY)
Admission: AD | Admit: 2014-04-12 | Discharge: 2014-04-15 | DRG: 774 | Disposition: A | Discharge status: Home / Self Care | Payer: Medicaid Other | Source: Ambulatory Visit | Attending: Obstetrics | Admitting: Obstetrics

## 2014-04-12 DIAGNOSIS — Z23 Encounter for immunization: Secondary | ICD-10-CM | POA: Diagnosis not present

## 2014-04-12 DIAGNOSIS — D509 Iron deficiency anemia, unspecified: Secondary | ICD-10-CM | POA: Diagnosis present

## 2014-04-12 DIAGNOSIS — Z3A37 37 weeks gestation of pregnancy: Secondary | ICD-10-CM | POA: Diagnosis present

## 2014-04-12 DIAGNOSIS — O99824 Streptococcus B carrier state complicating childbirth: Secondary | ICD-10-CM | POA: Diagnosis present

## 2014-04-12 DIAGNOSIS — O9081 Anemia of the puerperium: Secondary | ICD-10-CM | POA: Diagnosis present

## 2014-04-12 DIAGNOSIS — D62 Acute posthemorrhagic anemia: Secondary | ICD-10-CM | POA: Diagnosis not present

## 2014-04-12 DIAGNOSIS — O9989 Other specified diseases and conditions complicating pregnancy, childbirth and the puerperium: Secondary | ICD-10-CM | POA: Diagnosis present

## 2014-04-12 DIAGNOSIS — O30043 Twin pregnancy, dichorionic/diamniotic, third trimester: Secondary | ICD-10-CM | POA: Diagnosis present

## 2014-04-12 DIAGNOSIS — O321XX2 Maternal care for breech presentation, fetus 2: Principal | ICD-10-CM | POA: Diagnosis present

## 2014-04-12 DIAGNOSIS — R55 Syncope and collapse: Secondary | ICD-10-CM | POA: Diagnosis not present

## 2014-04-12 DIAGNOSIS — Z3490 Encounter for supervision of normal pregnancy, unspecified, unspecified trimester: Secondary | ICD-10-CM

## 2014-04-12 LAB — CBC
HCT: 24.8 % — ABNORMAL LOW (ref 36.0–46.0)
Hemoglobin: 7.9 g/dL — ABNORMAL LOW (ref 12.0–15.0)
MCH: 28.6 pg (ref 26.0–34.0)
MCHC: 31.9 g/dL (ref 30.0–36.0)
MCV: 89.9 fL (ref 78.0–100.0)
Platelets: 184 10*3/uL (ref 150–400)
RBC: 2.76 MIL/uL — ABNORMAL LOW (ref 3.87–5.11)
RDW: 14.6 % (ref 11.5–15.5)
WBC: 5.7 10*3/uL (ref 4.0–10.5)

## 2014-04-12 MED ORDER — ACETAMINOPHEN 325 MG PO TABS: 650.0000 mg | ORAL_TABLET | ORAL | Status: DC | PRN

## 2014-04-12 MED ORDER — LACTATED RINGERS IV SOLN: 500.0000 mL | INTRAVENOUS | Status: DC | PRN

## 2014-04-12 MED ORDER — LACTATED RINGERS IV SOLN
INTRAVENOUS | Status: DC
  Administered 2014-04-12: 22:00:00 via INTRAVENOUS

## 2014-04-12 MED ORDER — OXYTOCIN 40 UNITS IN LACTATED RINGERS INFUSION - SIMPLE MED
1.0000 m[IU]/min | INTRAVENOUS | Status: DC
  Administered 2014-04-12: 2 m[IU]/min via INTRAVENOUS
  Filled 2014-04-12: qty 1000

## 2014-04-12 MED ORDER — OXYCODONE-ACETAMINOPHEN 5-325 MG PO TABS: 2.0000 | ORAL_TABLET | ORAL | Status: DC | PRN

## 2014-04-12 MED ORDER — LIDOCAINE HCL (PF) 1 % IJ SOLN: 30.0000 mL | INTRAMUSCULAR | Status: DC | PRN

## 2014-04-12 MED ORDER — OXYTOCIN 40 UNITS IN LACTATED RINGERS INFUSION - SIMPLE MED: 62.5000 mL/h | INTRAVENOUS | Status: DC

## 2014-04-12 MED ORDER — CITRIC ACID-SODIUM CITRATE 334-500 MG/5ML PO SOLN
30.0000 mL | ORAL | Status: DC | PRN
  Filled 2014-04-12: qty 15

## 2014-04-12 MED ORDER — OXYTOCIN BOLUS FROM INFUSION: 500.0000 mL | INTRAVENOUS | Status: DC

## 2014-04-12 MED ORDER — SODIUM CHLORIDE 0.9 % IV SOLN
2.0000 g | Freq: Four times a day (QID) | INTRAVENOUS | Status: DC
  Administered 2014-04-12 – 2014-04-13 (×4): 2 g via INTRAVENOUS
  Filled 2014-04-12 (×6): qty 2000

## 2014-04-12 MED ORDER — TERBUTALINE SULFATE 1 MG/ML IJ SOLN
0.2500 mg | Freq: Once | INTRAMUSCULAR | Status: AC | PRN
Start: 2014-04-12 — End: 2014-04-12

## 2014-04-12 MED ORDER — OXYCODONE-ACETAMINOPHEN 5-325 MG PO TABS
1.0000 | ORAL_TABLET | ORAL | Status: DC | PRN
Start: 2014-04-12 — End: 2014-04-15
  Administered 2014-04-14: 1 via ORAL
  Filled 2014-04-12 (×3): qty 1

## 2014-04-12 MED ORDER — BUTORPHANOL TARTRATE 1 MG/ML IJ SOLN: 1.0000 mg | INTRAMUSCULAR | Status: DC | PRN

## 2014-04-12 MED ORDER — ONDANSETRON HCL 4 MG/2ML IJ SOLN
4.0000 mg | Freq: Four times a day (QID) | INTRAMUSCULAR | Status: DC | PRN
  Administered 2014-04-13: 4 mg via INTRAVENOUS
  Filled 2014-04-12: qty 2

## 2014-04-12 MED ORDER — FLEET ENEMA 7-19 GM/118ML RE ENEM: 1.0000 | ENEMA | Freq: Every day | RECTAL | Status: DC | PRN

## 2014-04-13 ENCOUNTER — Ambulatory Visit (HOSPITAL_COMMUNITY): Payer: Medicaid Other

## 2014-04-13 ENCOUNTER — Inpatient Hospital Stay (HOSPITAL_COMMUNITY): Payer: Medicaid Other | Admitting: Anesthesiology

## 2014-04-13 ENCOUNTER — Encounter (HOSPITAL_COMMUNITY): Payer: Self-pay | Admitting: *Deleted

## 2014-04-13 ENCOUNTER — Encounter (HOSPITAL_COMMUNITY)
Admission: AD | Disposition: A | Discharge status: Home / Self Care | Payer: Self-pay | Source: Ambulatory Visit | Attending: Obstetrics

## 2014-04-13 LAB — COMPREHENSIVE METABOLIC PANEL
ALT: 9 U/L (ref 0–35)
ANION GAP: 12 (ref 5–15)
AST: 22 U/L (ref 0–37)
Albumin: 2.9 g/dL — ABNORMAL LOW (ref 3.5–5.2)
Alkaline Phosphatase: 167 U/L — ABNORMAL HIGH (ref 39–117)
BILIRUBIN TOTAL: 0.5 mg/dL (ref 0.3–1.2)
BUN: 6 mg/dL (ref 6–23)
CO2: 22 mmol/L (ref 19–32)
CREATININE: 0.74 mg/dL (ref 0.50–1.10)
Calcium: 8.7 mg/dL (ref 8.4–10.5)
Chloride: 104 mmol/L (ref 96–112)
Glucose, Bld: 76 mg/dL (ref 70–99)
Potassium: 3.8 mmol/L (ref 3.5–5.1)
SODIUM: 138 mmol/L (ref 135–145)
Total Protein: 6.7 g/dL (ref 6.0–8.3)

## 2014-04-13 LAB — DIC (DISSEMINATED INTRAVASCULAR COAGULATION) PANEL
APTT: 29 s (ref 24–37)
INR: 1.29 (ref 0.00–1.49)
PLATELETS: 131 10*3/uL — AB (ref 150–400)
PROTHROMBIN TIME: 16.2 s — AB (ref 11.6–15.2)
SMEAR REVIEW: NONE SEEN

## 2014-04-13 LAB — DIC (DISSEMINATED INTRAVASCULAR COAGULATION)PANEL
D-Dimer, Quant: 10.87 ug/mL-FEU — ABNORMAL HIGH (ref 0.00–0.48)
Fibrinogen: 238 mg/dL (ref 204–475)

## 2014-04-13 LAB — MASSIVE TRANSFUSION PROTOCOL ORDER (BLOOD BANK NOTIFICATION)

## 2014-04-13 LAB — URIC ACID: Uric Acid, Serum: 6 mg/dL (ref 2.4–7.0)

## 2014-04-13 LAB — LACTATE DEHYDROGENASE: LDH: 202 U/L (ref 94–250)

## 2014-04-13 LAB — RPR: RPR: NONREACTIVE

## 2014-04-13 SURGERY — Surgical Case
Anesthesia: Epidural

## 2014-04-13 MED ORDER — LACTATED RINGERS IV SOLN
500.0000 mL | Freq: Once | INTRAVENOUS | Status: AC
  Administered 2014-04-13: 500 mL via INTRAVENOUS

## 2014-04-13 MED ORDER — MISOPROSTOL 200 MCG PO TABS: 800.0000 ug | ORAL_TABLET | Freq: Once | ORAL | Status: DC

## 2014-04-13 MED ORDER — SIMETHICONE 80 MG PO CHEW: 80.0000 mg | CHEWABLE_TABLET | ORAL | Status: DC | PRN

## 2014-04-13 MED ORDER — DIPHENOXYLATE-ATROPINE 2.5-0.025 MG PO TABS: 2.0000 | ORAL_TABLET | Freq: Once | ORAL | Status: DC

## 2014-04-13 MED ORDER — WITCH HAZEL-GLYCERIN EX PADS: 1.0000 "application " | MEDICATED_PAD | CUTANEOUS | Status: DC | PRN

## 2014-04-13 MED ORDER — MISOPROSTOL 200 MCG PO TABS
ORAL_TABLET | ORAL | Status: AC
  Administered 2014-04-13: 1000 ug
  Filled 2014-04-13: qty 5

## 2014-04-13 MED ORDER — DIPHENHYDRAMINE HCL 50 MG/ML IJ SOLN
12.5000 mg | INTRAMUSCULAR | Status: DC | PRN
  Administered 2014-04-13: 12.5 mg via INTRAVENOUS
  Filled 2014-04-13: qty 1

## 2014-04-13 MED ORDER — CARBOPROST TROMETHAMINE 250 MCG/ML IM SOLN: 250.0000 ug | INTRAMUSCULAR | Status: DC | PRN

## 2014-04-13 MED ORDER — PHENYLEPHRINE 40 MCG/ML (10ML) SYRINGE FOR IV PUSH (FOR BLOOD PRESSURE SUPPORT)
80.0000 ug | PREFILLED_SYRINGE | INTRAVENOUS | Status: DC | PRN
  Filled 2014-04-13: qty 20

## 2014-04-13 MED ORDER — OXYTOCIN 40 UNITS IN LACTATED RINGERS INFUSION - SIMPLE MED
62.5000 mL/h | INTRAVENOUS | Status: DC
  Administered 2014-04-13: 62.5 mL/h via INTRAVENOUS
  Filled 2014-04-13: qty 1000

## 2014-04-13 MED ORDER — OXYTOCIN 40 UNITS IN LACTATED RINGERS INFUSION - SIMPLE MED: 250.0000 mL/h | INTRAVENOUS | Status: DC

## 2014-04-13 MED ORDER — IBUPROFEN 600 MG PO TABS
600.0000 mg | ORAL_TABLET | Freq: Four times a day (QID) | ORAL | Status: DC
  Administered 2014-04-13: 600 mg via ORAL
  Filled 2014-04-13: qty 1

## 2014-04-13 MED ORDER — EPHEDRINE 5 MG/ML INJ: 10.0000 mg | INTRAVENOUS | Status: DC | PRN

## 2014-04-13 MED ORDER — OXYCODONE-ACETAMINOPHEN 5-325 MG PO TABS
1.0000 | ORAL_TABLET | ORAL | Status: DC | PRN
  Administered 2014-04-13 – 2014-04-15 (×2): 1 via ORAL
  Filled 2014-04-13 (×3): qty 1

## 2014-04-13 MED ORDER — SENNOSIDES-DOCUSATE SODIUM 8.6-50 MG PO TABS
2.0000 | ORAL_TABLET | ORAL | Status: DC
  Administered 2014-04-15: 2 via ORAL
  Filled 2014-04-13: qty 2

## 2014-04-13 MED ORDER — DIBUCAINE 1 % RE OINT: 1.0000 "application " | TOPICAL_OINTMENT | RECTAL | Status: DC | PRN

## 2014-04-13 MED ORDER — LACTATED RINGERS IV BOLUS (SEPSIS)
500.0000 mL | Freq: Once | INTRAVENOUS | Status: DC
Start: 2014-04-13 — End: 2014-04-15

## 2014-04-13 MED ORDER — FENTANYL 2.5 MCG/ML BUPIVACAINE 1/10 % EPIDURAL INFUSION (WH - ANES)
INTRAMUSCULAR | Status: DC | PRN
  Administered 2014-04-13: 12 mL/h via EPIDURAL

## 2014-04-13 MED ORDER — CARBOPROST TROMETHAMINE 250 MCG/ML IM SOLN
INTRAMUSCULAR | Status: AC
  Filled 2014-04-13: qty 1

## 2014-04-13 MED ORDER — CARBOPROST TROMETHAMINE 250 MCG/ML IM SOLN
250.0000 ug | Freq: Once | INTRAMUSCULAR | Status: AC
  Administered 2014-04-13: 250 ug via INTRAMUSCULAR

## 2014-04-13 MED ORDER — PRENATAL MULTIVITAMIN CH
1.0000 | ORAL_TABLET | Freq: Every day | ORAL | Status: DC
  Administered 2014-04-15: 1 via ORAL
  Filled 2014-04-13 (×2): qty 1

## 2014-04-13 MED ORDER — PROMETHAZINE HCL 25 MG PO TABS: 25.0000 mg | ORAL_TABLET | Freq: Four times a day (QID) | ORAL | Status: DC | PRN

## 2014-04-13 MED ORDER — BENZOCAINE-MENTHOL 20-0.5 % EX AERO: 1.0000 "application " | INHALATION_SPRAY | CUTANEOUS | Status: DC | PRN

## 2014-04-13 MED ORDER — ONDANSETRON HCL 4 MG PO TABS: 4.0000 mg | ORAL_TABLET | ORAL | Status: DC | PRN

## 2014-04-13 MED ORDER — ZOLPIDEM TARTRATE 5 MG PO TABS: 5.0000 mg | ORAL_TABLET | Freq: Every evening | ORAL | Status: DC | PRN

## 2014-04-13 MED ORDER — ONDANSETRON HCL 4 MG/2ML IJ SOLN: 4.0000 mg | INTRAMUSCULAR | Status: DC | PRN

## 2014-04-13 MED ORDER — DIPHENHYDRAMINE HCL 25 MG PO CAPS: 25.0000 mg | ORAL_CAPSULE | Freq: Four times a day (QID) | ORAL | Status: DC | PRN

## 2014-04-13 MED ORDER — METHYLERGONOVINE MALEATE 0.2 MG PO TABS
0.2000 mg | ORAL_TABLET | Freq: Four times a day (QID) | ORAL | Status: AC
  Administered 2014-04-13 – 2014-04-15 (×5): 0.2 mg via ORAL
  Filled 2014-04-13 (×6): qty 1

## 2014-04-13 MED ORDER — CALCIUM CARBONATE ANTACID 500 MG PO CHEW
400.0000 mg | CHEWABLE_TABLET | Freq: Four times a day (QID) | ORAL | Status: DC | PRN
  Administered 2014-04-13: 400 mg via ORAL
  Filled 2014-04-13: qty 1

## 2014-04-13 MED ORDER — OXYCODONE-ACETAMINOPHEN 5-325 MG PO TABS: 2.0000 | ORAL_TABLET | ORAL | Status: DC | PRN

## 2014-04-13 MED ORDER — OXYTOCIN 40 UNITS IN LACTATED RINGERS INFUSION - SIMPLE MED
INTRAVENOUS | Status: AC
  Filled 2014-04-13: qty 1000

## 2014-04-13 MED ORDER — LANOLIN HYDROUS EX OINT: TOPICAL_OINTMENT | CUTANEOUS | Status: DC | PRN

## 2014-04-13 MED ORDER — OXYTOCIN 10 UNIT/ML IJ SOLN: 40.0000 [IU] | INTRAMUSCULAR | Status: DC

## 2014-04-13 MED ORDER — LIDOCAINE HCL (PF) 1 % IJ SOLN
INTRAMUSCULAR | Status: DC | PRN
  Administered 2014-04-13: 4 mL
  Administered 2014-04-13: 6 mL

## 2014-04-13 MED ORDER — FERROUS SULFATE 325 (65 FE) MG PO TABS
325.0000 mg | ORAL_TABLET | Freq: Two times a day (BID) | ORAL | Status: DC
  Administered 2014-04-14 – 2014-04-15 (×3): 325 mg via ORAL
  Filled 2014-04-13 (×3): qty 1

## 2014-04-13 MED ORDER — FENTANYL 2.5 MCG/ML BUPIVACAINE 1/10 % EPIDURAL INFUSION (WH - ANES)
14.0000 mL/h | INTRAMUSCULAR | Status: DC | PRN
  Administered 2014-04-13 (×2): 14 mL/h via EPIDURAL
  Filled 2014-04-13 (×2): qty 125

## 2014-04-13 MED ORDER — TETANUS-DIPHTH-ACELL PERTUSSIS 5-2.5-18.5 LF-MCG/0.5 IM SUSP
0.5000 mL | Freq: Once | INTRAMUSCULAR | Status: DC
  Filled 2014-04-13: qty 0.5

## 2014-04-13 MED ORDER — PHENYLEPHRINE 40 MCG/ML (10ML) SYRINGE FOR IV PUSH (FOR BLOOD PRESSURE SUPPORT): 80.0000 ug | PREFILLED_SYRINGE | INTRAVENOUS | Status: DC | PRN

## 2014-04-13 SURGICAL SUPPLY — 32 items
CLAMP CORD UMBIL (MISCELLANEOUS) IMPLANT
CLOTH BEACON ORANGE TIMEOUT ST (SAFETY) IMPLANT
DRAPE SHEET LG 3/4 BI-LAMINATE (DRAPES) IMPLANT
DRSG OPSITE POSTOP 4X10 (GAUZE/BANDAGES/DRESSINGS) IMPLANT
DURAPREP 26ML APPLICATOR (WOUND CARE) IMPLANT
ELECT REM PT RETURN 9FT ADLT (ELECTROSURGICAL)
ELECTRODE REM PT RTRN 9FT ADLT (ELECTROSURGICAL) IMPLANT
EXTRACTOR VACUUM M CUP 4 TUBE (SUCTIONS) IMPLANT
EXTRACTOR VACUUM M CUP 4' TUBE (SUCTIONS)
GLOVE BIO SURGEON STRL SZ8.5 (GLOVE) IMPLANT
GOWN STRL REUS W/TWL 2XL LVL3 (GOWN DISPOSABLE) IMPLANT
GOWN STRL REUS W/TWL LRG LVL3 (GOWN DISPOSABLE) IMPLANT
KIT ABG SYR 3ML LUER SLIP (SYRINGE) IMPLANT
LIQUID BAND (GAUZE/BANDAGES/DRESSINGS) IMPLANT
NEEDLE HYPO 25X5/8 SAFETYGLIDE (NEEDLE) IMPLANT
NS IRRIG 1000ML POUR BTL (IV SOLUTION) IMPLANT
PACK C SECTION WH (CUSTOM PROCEDURE TRAY) IMPLANT
PAD OB MATERNITY 4.3X12.25 (PERSONAL CARE ITEMS) IMPLANT
STAPLER VISISTAT 35W (STAPLE) IMPLANT
SUT CHROMIC 0 CT 802H (SUTURE) IMPLANT
SUT CHROMIC 0 MO4 CR (SUTURE) IMPLANT
SUT CHROMIC 1 CTX 36 (SUTURE) IMPLANT
SUT CHROMIC 2 0 SH (SUTURE) IMPLANT
SUT GUT PLAIN 0 CT-3 TAN 27 (SUTURE) IMPLANT
SUT MON AB 4-0 PS1 27 (SUTURE) IMPLANT
SUT PDS AB 0 CTX 36 PDP370T (SUTURE) IMPLANT
SUT VIC AB 0 CT1 18XCR BRD8 (SUTURE) IMPLANT
SUT VIC AB 0 CT1 8-18 (SUTURE)
SUT VIC AB 0 CTX 36 (SUTURE)
SUT VIC AB 0 CTX36XBRD ANBCTRL (SUTURE) IMPLANT
TOWEL OR 17X24 6PK STRL BLUE (TOWEL DISPOSABLE) IMPLANT
TRAY FOLEY CATH 14FR (SET/KITS/TRAYS/PACK) IMPLANT

## 2014-04-13 NOTE — Anesthesia Procedure Notes (Signed)
Epidural  Start time: 04/13/2014 3:25 AM End time: 04/13/2014 3:40 AM  Staffing Anesthesiologist: Towana BadgerPOTISEK, Macaela Presas Performed by: anesthesiologist   Preanesthetic Checklist Completed: patient identified, pre-op evaluation, timeout performed, IV checked, risks and benefits discussed and monitors and equipment checked  Epidural Patient position: sitting Prep: ChloraPrep Patient monitoring: blood pressure Approach: midline Location: L3-L4 Injection technique: LOR saline  Needle:  Needle type: Tuohy  Needle gauge: 17 G Needle length: 9 cm Needle insertion depth: 5 cm Catheter size: 19 Gauge Catheter at skin depth: 10 cm Test dose: negative  Assessment Events: blood not aspirated, injection not painful, no injection resistance, negative IV test and no paresthesia  Additional Notes Informed consent obtained prior to proceeding including risk of failure, 1% risk of PDPH, risk of minor discomfort and bruising.  Discussed rare but serious complications including epidural abscess, permanent nerve injury, epidural hematoma.  Discussed alternatives to epidural analgesia and patient desires to proceed.  Timeout performed pre-procedure verifying patient name, procedure, and platelet count.  Patient tolerated procedure well.  Catheter secured at 10 at the skin.  SA test negative as confirmed by no motor block of hip abduction at 5 min post injection of 50 mg of lidocaine into the epidural catheter.  Bupivacaine 0.1% with fentanyl 2.505mcg/ml infused post procedure at 7212ml/hr with PCEA of 9ml every 10 mins.

## 2014-04-13 NOTE — Transfer of Care (Signed)
Immediate Anesthesia Transfer of Care Note  Patient: Julia Cain  Procedure(s) Performed: Procedure(s): VAGINAL DELIVERY  (N/A)  Patient Location: PACU and L&D  Anesthesia Type:Epidural  Level of Consciousness: awake, alert  and oriented  Airway & Oxygen Therapy: Patient Spontanous Breathing  Post-op Assessment: Report given to RN and Post -op Vital signs reviewed and stable  Post vital signs: Reviewed and stable  Last Vitals:  Filed Vitals:   04/13/14 1753  BP: 145/93  Pulse: 78  Temp:   Resp:     Complications: No apparent anesthesia complications

## 2014-04-13 NOTE — Progress Notes (Signed)
Patient ID: Melvenia BeamBrittany J Lacko, female   DOB: 25-Jul-1990, 24 y.o.   MRN: [id number]  Responded to Code Hemorrhage on patient on Postpartum.  Patient had soaked to pads.  Methergine already given.  Pitocin was bolused and cytotec 1000mcg given PR.  In and out cath done.   About 1000mL of clot was cleaned from uterus.  Hemobate was also given.  Due to patient syncope, MTP started.  Second line started and IVF bolused.  Uterus became firm and plasma given.  Will give 2 units of PRBC.  Pt transferred to ICU.  Dr Clearance CootsHarper notified of situation.  Discussed care with family.  Patient stable on my departure.  Levie HeritageJacob J Elio Haden, DO 04/14/2014 12:00 AM

## 2014-04-13 NOTE — Op Note (Signed)
Preop diagnosis twin gestation at 5337 weeks and   twin A vertex twin B vertex patient became fully dilated and pushed satisfactorily and had a normal vaginal delivery of twin A Apgar 9 and 9 at 1735 twin a was a female on examination twin B was presenting as a vertex and amniotomy was performed and the fetal heart was down for more than 7 minutes patient successfully pushed twin B out female Apgar 3 and 8 team in attendance at 1800 placenta spontaneous and sent to pathology there were no lacerations and the cord for twin B was short

## 2014-04-13 NOTE — Anesthesia Preprocedure Evaluation (Addendum)
Anesthesia Evaluation  Patient identified by MRN, date of birth, ID band Patient awake    Reviewed: Allergy & Precautions, NPO status , Patient's Chart, lab work & pertinent test results  History of Anesthesia Complications Negative for: history of anesthetic complications  Airway Mallampati: II  TM Distance: >3 FB Neck ROM: Full    Dental no notable dental hx.    Pulmonary neg pulmonary ROS, Current Smoker,  breath sounds clear to auscultation  Pulmonary exam normal       Cardiovascular hypertension, negative cardio ROS  Rhythm:Regular Rate:Normal     Neuro/Psych negative neurological ROS  negative psych ROS   GI/Hepatic negative GI ROS, Neg liver ROS, GERD-  ,  Endo/Other  negative endocrine ROS  Renal/GU negative Renal ROS  negative genitourinary   Musculoskeletal negative musculoskeletal ROS (+)   Abdominal Normal abdominal exam  (+)   Peds negative pediatric ROS (+)  Hematology negative hematology ROS (+)   Anesthesia Other Findings   Reproductive/Obstetrics negative OB ROS                             Anesthesia Physical Anesthesia Plan  ASA: II  Anesthesia Plan: Epidural   Post-op Pain Management:    Induction:   Airway Management Planned:   Additional Equipment:   Intra-op Plan:   Post-operative Plan:   Informed Consent: I have reviewed the patients History and Physical, chart, labs and discussed the procedure including the risks, benefits and alternatives for the proposed anesthesia with the patient or authorized representative who has indicated his/her understanding and acceptance.     Plan Discussed with:   Anesthesia Plan Comments: ([age]yr old [id number] at 37 weeks and 1 day with twin pregnancy.  Discussed R/B/A to epidural analgesia and patient wishes to proceed.  Plt. 184.)       Anesthesia Quick Evaluation

## 2014-04-13 NOTE — H&P (Signed)
This is Dr. Francoise CeoBernard Alvan Culpepper dictating the history and physical on  Julia Cain she's a 24 year old gravida 5 para 1122 had twin gestation in the past and her EDC is 331 she is 37 weeks and a day has been followed by maternal-fetal medicine with ultrasounds on NST twin A persistently vertex twin B breech an ultrasound on Monday showed increased up was with twin B and MFM suggested induction of labor on admission her cervix was 2 cm 80% and amniotomy performed twin A was a vertex and the fluid was clear and she was started on low-dose Pitocin she is now on 4-5 cm 90% and she has her epidural and she got penicillin for positive GBS Past medical history twin gestation in the past and 2 spontaneous abortions Social history negative Past surgical history negative System review negative Physical exam well-developed female in labor HEENT negative Lungs clear to P&A Heart regular rhythm no murmurs no gallops Breasts negative Abdomen distended with twins gestation Pelvic as described above Extremities negative

## 2014-04-13 NOTE — Progress Notes (Addendum)
2255  Pt unconscious/ammonia given/  O2 started with rebreather   hemobate 250 given and cytotec 1000 mg given.    LR given wide open with pitt at 125 ml/hr    approx 1500 cc EBL    Ammonia given again     Very hard to arouse   Had to give sternal rub      2305   Labs taken    2315 foley in   Plasma started   Vomited large amount  Undigested food   Transported to AICU

## 2014-04-14 ENCOUNTER — Encounter (HOSPITAL_COMMUNITY): Payer: Self-pay | Admitting: *Deleted

## 2014-04-14 LAB — CBC
HCT: 24.3 % — ABNORMAL LOW (ref 36.0–46.0)
HEMATOCRIT: 15.4 % — AB (ref 36.0–46.0)
HEMOGLOBIN: 5 g/dL — AB (ref 12.0–15.0)
Hemoglobin: 8.1 g/dL — ABNORMAL LOW (ref 12.0–15.0)
MCH: 28.9 pg (ref 26.0–34.0)
MCH: 29.6 pg (ref 26.0–34.0)
MCHC: 32.5 g/dL (ref 30.0–36.0)
MCHC: 33.3 g/dL (ref 30.0–36.0)
MCV: 89 fL (ref 78.0–100.0)
MCV: 88.7 fL (ref 78.0–100.0)
Platelets: 132 10*3/uL — ABNORMAL LOW (ref 150–400)
Platelets: 117 10*3/uL — ABNORMAL LOW (ref 150–400)
RBC: 1.73 MIL/uL — ABNORMAL LOW (ref 3.87–5.11)
RBC: 2.74 MIL/uL — ABNORMAL LOW (ref 3.87–5.11)
RDW: 14.4 % (ref 11.5–15.5)
RDW: 14 % (ref 11.5–15.5)
WBC: 10.5 10*3/uL (ref 4.0–10.5)
WBC: 13.8 10*3/uL — AB (ref 4.0–10.5)

## 2014-04-14 LAB — RAPID URINE DRUG SCREEN, HOSP PERFORMED
Amphetamines: NOT DETECTED
Barbiturates: NOT DETECTED
Benzodiazepines: NOT DETECTED
Cocaine: NOT DETECTED
OPIATES: NOT DETECTED
TETRAHYDROCANNABINOL: POSITIVE — AB

## 2014-04-14 MED ORDER — LACTATED RINGERS IV SOLN: INTRAVENOUS | Status: DC

## 2014-04-14 MED ORDER — NICOTINE 14 MG/24HR TD PT24
14.0000 mg | MEDICATED_PATCH | Freq: Every day | TRANSDERMAL | Status: DC
  Administered 2014-04-14 – 2014-04-15 (×2): 14 mg via TRANSDERMAL
  Filled 2014-04-14 (×3): qty 1

## 2014-04-14 MED ORDER — ACETAMINOPHEN 325 MG PO TABS
650.0000 mg | ORAL_TABLET | ORAL | Status: DC | PRN
  Administered 2014-04-14: 650 mg via ORAL
  Filled 2014-04-14: qty 2

## 2014-04-14 MED ORDER — PNEUMOCOCCAL VAC POLYVALENT 25 MCG/0.5ML IJ INJ
0.5000 mL | INJECTION | INTRAMUSCULAR | Status: AC
  Administered 2014-04-15: 0.5 mL via INTRAMUSCULAR
  Filled 2014-04-14: qty 0.5

## 2014-04-14 MED ORDER — OXYTOCIN 40 UNITS IN LACTATED RINGERS INFUSION - SIMPLE MED
62.5000 mL/h | INTRAVENOUS | Status: AC
Start: 2014-04-14 — End: 2014-04-15
  Administered 2014-04-14: 62.5 mL/h via INTRAVENOUS

## 2014-04-14 NOTE — Progress Notes (Signed)
CRITICAL VALUE ALERT  Critical value received:  Hemaglobin 5.0   Date of notification:  04/14/14  Time of notification:  0030  Critical value read back: yes  Nurse who received alert:  Henderson NewcomerStephanie Christiana Gurevich  MD notified (1st page):  [phone]  Responding MD:  Dr. Clearance CootsHarper  Time MD responded:  [id number]

## 2014-04-14 NOTE — Progress Notes (Signed)
Post Partum Day 1 Subjective: no complaints  Objective: Blood pressure 141/87, pulse 78, temperature 98.8 F (37.1 C), temperature source Oral, resp. rate 18, height 5\' 2"  (1.575 m), weight 134 lb 6.4 oz (60.963 kg), last menstrual period 07/27/2013, SpO2 100 %, unknown if currently breastfeeding.  Physical Exam:  General: alert and no distress Lochia: appropriate Uterine Fundus: firm Incision: none DVT Evaluation: No evidence of DVT seen on physical exam.   Recent Labs  04/13/14 2306 04/14/14 0815  HGB 5.0* 8.1*  HCT 15.4* 24.3*    Assessment/Plan: PPD#1.  Twins.  Postpartum uterine atony with hemorrhage overnight.  Stable after uterotonic and transfusion of blood products.  Will continue to monitor closely.   LOS: 2 days   Romana Deaton A 04/14/2014, 2:51 PM

## 2014-04-14 NOTE — Lactation Note (Signed)
This note was copied from the chart of Julia Cain. Lactation Consultation Note  Patient Name: Julia Tomasita CrumbleBrittany Cain ZOXWR'UToday's Date: 04/14/2014 Reason for consult: Initial assessment;Infant < 6lbs;Multiple gestation;NICU baby;Other (Comment) (mom planning to pump and bottle-feed for both twins) Mom has 215 yo twins and now another set of twins, born at early term but one baby in NICU and other twin less than 6 pounds and requiring q3h feedings.  Mom had pp hemorrhage and required transfer to AICU but returned to pp unit today.  Mom says she plans to pump and bottle-feed ebm but has not yet started pumping.  Mom says she knows how to perform breast massage and hand expression and used a WIC lactina pump for 6 months with her older twins.  One of the 26 yo twins also latched and breastfed for first 4 months but she is not planning to latch either twin this time.  LC reviewed importance of frequent STS with both babies, provided the LPI handout which reviews special care needs of LPI and "early term" babies.  NICU pumping handout also given and mom reassured that she may obtain "nothing to a few drops" right now but with supply and demand and regular q3h pumping plus hand expression and breast massage, her milk supply will increase.  LC reported mom's plan and the pump set-up but needing reinforcement later to her RN, Julia Cain.  [facility] pointed out the difference in storage and care of ebm for NICU twin.  Mom encouraged to feed baby 8-12 times/24 hours and with feeding cues. LC encouraged review of Baby and Me pp 9, 14 and 20-25 for STS and BF information. LC provided [employer] Resource brochure and reviewed [facility] services and list of community and web site resources.    Maternal Data Formula Feeding for Exclusion: Yes Reason for exclusion: Mother's choice to formula and breast feed on admission;Admission to Intensive Care Unit (ICU) post-partum (one twin in NICU and mom was in AICU due to pp hemorrhage) Has patient been  taught Hand Expression?: Yes (mmo informs LC that she remembers how to hand express; pumped for 6 months with 26 yo twins) Does the patient have breastfeeding experience prior to this delivery?: Yes  Feeding Feeding Type: Bottle Fed - Formula Nipple Type: Slow - flow  LATCH Score/Interventions            N/A - mom planning to pump only          Lactation Tools Discussed/Used WIC Program: Yes Pump Review: Setup, frequency, and cleaning;Milk Storage;Other (comment) (LC pointed out shorter time for ebm for NICU baby to be "iat room temp" before feeding to baby) Initiated by:: LC Date initiated:: 04/14/14 STS, q3h pumping and LPI handout NICU pumping booklet Breast massage and hand expression  Consult Status Consult Status: Follow-up Date: 04/15/14 Follow-up type: In-patient    Julia Cain, Julia Cain 04/14/2014, 10:50 PM

## 2014-04-14 NOTE — Progress Notes (Signed)
Results for Julia Cain, Julia Cain (MRN [id number]) as of 04/14/2014 11:20  Ref. Range 04/14/2014 08:15  WBC Latest Range: 4.0-10.5 K/uL 13.8 (H)  RBC Latest Range: 3.87-5.11 MIL/uL 2.74 (L)  Hemoglobin Latest Range: 12.0-15.0 g/dL 8.1 (L)  HCT Latest Range: 36.0-46.0 % 24.3 (L)  MCV Latest Range: 78.0-100.0 fL 88.7  MCH Latest Range: 26.0-34.0 pg 29.6  MCHC Latest Range: 30.0-36.0 g/dL 65.733.3  RDW Latest Range: 11.5-15.5 % 14.0  Platelets Latest Range: 150-400 K/uL 117 (L)   MD aware of results. Updates provided-new orders received.

## 2014-04-14 NOTE — Progress Notes (Signed)
Rapid response call at 2240.  Upon arrival to MBU101 FP MD, MBU charge RN, BS charge RN, RROB RN, respiratory, pt's RN, and multiple other RNs at bedside. EBL was 1500. Pt's OB notified. Cytotec 1000 given rectally, pitocin @ 125, methergine and hemabate given. Second IV started. Pt's BP dropped to 74/50 then had a syncopal episode. Pt awakened with ammonia. Labs drawn, MTP initiated. Pt very drowsy but BPs continue to rise. Foley catheter inserted, plasma started, pt transferred to AICU.

## 2014-04-14 NOTE — Anesthesia Postprocedure Evaluation (Signed)
Anesthesia Post Note  Patient: Julia Cain  Procedure(s) Performed: Procedure(s) (LRB): VAGINAL DELIVERY  (N/A)  Anesthesia type: Epidural  Patient location: Mother/Baby  Post pain: Pain level controlled  Post assessment: Post-op Vital signs reviewed  Last Vitals:  Filed Vitals:   04/14/14 1000  BP: 141/76  Pulse: 68  Temp:   Resp: 18    Post vital signs: Reviewed  Level of consciousness:alert  Complications: No apparent anesthesia complications

## 2014-04-14 NOTE — Progress Notes (Signed)
Patient arrived to unit around 2035, fundus firm and 2 above umbilicus with small amount of bleeding. Per report patient voided prior to transport. Prior to one hour assessment patient had some moderate bleeding with 2 soft ball size clots in the bathroom toilet per nurse tech. Nurse  called to room around 2135 but unable to assess clots. Patient transported back to bed and reassessed. Patient fundus firm but deviated to the right 2 above the umbilicus. Second nurse called to room to reassess patient and found to have some moderate bleeding. Dr. Clearance CootsHarper notified, ordered methergine tab 0.2mg  Q 6hr and Pitocin overnight going at 62.5 ml/hr. Methergine given to patient and pitocin started at 2237. At that time patient reassessed and found to be bleeding more excessively. Code Hemorrhage called on patient and patient became unresponsive, Code Blue called on patient thereafter. Code team arrived and assumed care of patient. [facility] Report given to [place] RiverStephanie, Charity fundraiserN.

## 2014-04-15 MED ORDER — FUSION PLUS PO CAPS: 1.0000 | ORAL_CAPSULE | Freq: Every day | ORAL | Status: DC

## 2014-04-15 MED ORDER — OXYCODONE-ACETAMINOPHEN 5-325 MG PO TABS: 1.0000 | ORAL_TABLET | ORAL | Status: DC | PRN
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
## 2014-04-15 NOTE — Progress Notes (Signed)
Post Partum Day 2 Subjective: no complaints  Objective: Blood pressure 118/79, pulse 70, temperature 98.4 F (36.9 C), temperature source Oral, resp. rate 16, height 5\' 2"  (1.575 m), weight 134 lb 6.4 oz (60.963 kg), last menstrual period 07/27/2013, SpO2 100 %, unknown if currently breastfeeding.  Physical Exam:  General: alert and no distress Lochia: appropriate Uterine Fundus: firm Incision: none DVT Evaluation: No evidence of DVT seen on physical exam.   Recent Labs  04/13/14 2306 04/14/14 0815  HGB 5.0* 8.1*  HCT 15.4* 24.3*    Assessment/Plan: Anemia.  Chronic iron deficiency and postpartum hemorrhage.  Stable.  Iron Rx. Discharge home   LOS: 3 days   Julia Cain A 04/15/2014, 8:01 AM

## 2014-04-15 NOTE — Lactation Note (Signed)
This note was copied from the chart of Julia [place] Vincent [place] Grenad[place]any Gammage. Lactation Consultation Note  [name] is being discharged today and was gathering her belongings so that she could leave.  Mom has reported that she has not yet started pumping but plans to at home.  [facility] [place] [facility] [facility] and [facility].  She is aware that there is a pumping room in the NICU with a pump in it.  Demonstrated how to use the piston as a double hand pump until [facility].    Patient Name: Julia Tomasita CrumbleBrittany Cain VWUJW'JToday's Date: 04/15/2014     Maternal Data    Feeding Feeding Type: Bottle Fed - Formula  LATCH Score/Interventions                      Lactation Tools Discussed/Used     Consult Status      Soyla DryerJoseph, Jimesha Rising 04/15/2014, 2:00 PM

## 2014-04-15 NOTE — Discharge Summary (Signed)
Obstetric Discharge Summary Reason for Admission: induction of labor Prenatal Procedures: NST and ultrasound Intrapartum Procedures: spontaneous vaginal delivery Postpartum Procedures: transfusion 2 units PRBC's Complications-Operative and Postpartum: hemorrhage and postpartum uterine atony HEMOGLOBIN  Date Value Ref Range Status  04/14/2014 8.1* 12.0 - 15.0 g/dL Final    Comment:    REPEATED TO VERIFY DELTA CHECK NOTED POST TRANSFUSION SPECIMEN    HCT  Date Value Ref Range Status  04/14/2014 24.3* 36.0 - 46.0 % Final    Physical Exam:  General: alert and no distress Lochia: appropriate Uterine Fundus: firm Incision: none DVT Evaluation: No evidence of DVT seen on physical exam.  Discharge Diagnoses: Term Pregnancy-delivered and postpartum uterine atony with hemorrhage, resolved  Discharge Information: Date: 04/15/2014 Activity: pelvic rest Diet: routine Medications: PNV, Colace, Iron and Percocet Condition: stable Instructions: refer to practice specific booklet Discharge to: home Follow-up Information    Follow up with MARSHALL,BERNARD A, MD In 6 weeks.   Specialty:  Obstetrics and Gynecology   Contact information:   [address] [place] [address] [phone]       Newborn Data:   Romeo RabonJenkins, Boy Rylah [[id number]][[id number]]  Live born female  Birth Weight: 5 lb 11.5 oz (2594 g) APGAR: 9, 9   Earleen [place], GirlB [name] [[id number]][[id number]]  Live born female  Birth Weight: 4 lb 3.4 oz (1910 g) APGAR: 3, 8  Home with mother.  Ryah Cribb A 04/15/2014, 8:06 AM

## 2014-04-16 ENCOUNTER — Encounter (HOSPITAL_COMMUNITY): Payer: Self-pay | Admitting: Obstetrics

## 2014-04-16 LAB — TYPE AND SCREEN
ABO/RH(D): A POS
ANTIBODY SCREEN: NEGATIVE
UNIT DIVISION: 0
UNIT DIVISION: 0
Unit division: 0
Unit division: 0
Unit division: 0
Unit division: 0

## 2014-04-16 LAB — PREPARE FRESH FROZEN PLASMA
UNIT DIVISION: 0
UNIT DIVISION: 0
Unit division: 0
Unit division: 0

## 2014-04-16 NOTE — Progress Notes (Signed)
Post discharge chart review completed.  

## 2014-04-20 ENCOUNTER — Ambulatory Visit (HOSPITAL_COMMUNITY): Payer: Medicaid Other

## 2014-10-31 ENCOUNTER — Emergency Department (HOSPITAL_COMMUNITY): Payer: Medicaid Other

## 2014-10-31 ENCOUNTER — Encounter (HOSPITAL_COMMUNITY): Payer: Self-pay

## 2014-10-31 ENCOUNTER — Emergency Department (HOSPITAL_COMMUNITY)
Admission: EM | Admit: 2014-10-31 | Discharge: 2014-10-31 | Disposition: A | Discharge status: Home / Self Care | Payer: Medicaid Other | Attending: Emergency Medicine | Admitting: Emergency Medicine

## 2014-10-31 DIAGNOSIS — Y9389 Activity, other specified: Secondary | ICD-10-CM | POA: Diagnosis not present

## 2014-10-31 DIAGNOSIS — Y998 Other external cause status: Secondary | ICD-10-CM | POA: Insufficient documentation

## 2014-10-31 DIAGNOSIS — Z720 Tobacco use: Secondary | ICD-10-CM | POA: Diagnosis not present

## 2014-10-31 DIAGNOSIS — S3992XA Unspecified injury of lower back, initial encounter: Secondary | ICD-10-CM | POA: Diagnosis not present

## 2014-10-31 DIAGNOSIS — J45909 Unspecified asthma, uncomplicated: Secondary | ICD-10-CM | POA: Insufficient documentation

## 2014-10-31 DIAGNOSIS — Z8619 Personal history of other infectious and parasitic diseases: Secondary | ICD-10-CM | POA: Insufficient documentation

## 2014-10-31 DIAGNOSIS — Y9289 Other specified places as the place of occurrence of the external cause: Secondary | ICD-10-CM | POA: Diagnosis not present

## 2014-10-31 DIAGNOSIS — Z79899 Other long term (current) drug therapy: Secondary | ICD-10-CM | POA: Insufficient documentation

## 2014-10-31 MED ORDER — NAPROXEN 375 MG PO TABS: 375.0000 mg | ORAL_TABLET | Freq: Two times a day (BID) | ORAL | Status: DC

## 2014-10-31 NOTE — ED Notes (Signed)
Patient tells nurse that she wants to leave because she needs to go to work so she doesn't lose her job. MD made aware and is at bedside at this time.

## 2014-10-31 NOTE — ED Notes (Signed)
Patient leaves ambulatory at this time and is going straight to work
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
## 2014-10-31 NOTE — Discharge Instructions (Signed)
Tailbone Injury The tailbone (coccyx) is the small bone at the lower end of the spine. A tailbone injury may involve stretched ligaments, bruising, or a broken bone (fracture). Women are more vulnerable to this injury due to having a wider pelvis. CAUSES  This type of injury typically occurs from falling and landing on the tailbone. Repeated strain or friction from actions such as rowing and bicycling may also injure the area. The tailbone can be injured during childbirth. Infections or tumors may also press on the tailbone and cause pain. Sometimes, the cause of injury is unknown. SYMPTOMS   Bruising.  Pain when sitting.  Painful bowel movements.  In women, pain during intercourse. DIAGNOSIS  Your caregiver can diagnose a tailbone injury based on your symptoms and a physical exam. X-rays may be taken if a fracture is suspected. Your caregiver may also use an MRI scan imaging test to evaluate your symptoms. TREATMENT  Your caregiver may prescribe medicines to help relieve your pain. Most tailbone injuries heal on their own in 4 to 6 weeks. However, if the injury is caused by an infection or tumor, the recovery period may vary. PREVENTION  Wear appropriate padding and sports gear when bicycling and rowing. This can help prevent an injury from repeated strain or friction. HOME CARE INSTRUCTIONS   Put ice on the injured area.  Put ice in a plastic bag.  Place a towel between your skin and the bag.  Leave the ice on for 15-20 minutes, every hour while awake for the first 1 to 2 days.  Sit on a large, rubber or inflated ring or cushion to ease your pain. Lean forward when sitting to help decrease discomfort.  Avoid sitting for long periods of time.  Increase your activity as the pain allows.  Only take over-the-counter or prescription medicines for pain, discomfort, or fever as directed by your caregiver.  You may use stool softeners if it is painful to have a bowel movement, or as  directed by your caregiver.  Eat a diet with plenty of fiber to help prevent constipation.  Keep all follow-up appointments as directed by your caregiver. SEEK MEDICAL CARE IF:   Your pain becomes worse.  Your bowel movements cause a great deal of discomfort.  You are unable to have a bowel movement.  You have a fever. MAKE SURE YOU:  Understand these instructions.  Will watch your condition.  Will get help right away if you are not doing well or get worse. Document Released: 01/17/2000 Document Revised: 04/13/2011 Document Reviewed: 08/14/2010 [facility] Patient Information 2015 [place], [place]. This information is not intended to replace advice given to you by your health care provider. Make sure you discuss any questions you have with your health care provider.  Assault, General Assault includes any behavior, whether intentional or reckless, which results in bodily injury to another person and/or damage to property. Included in this would be any behavior, intentional or reckless, that by its nature would be understood (interpreted) by a reasonable person as intent to harm another person or to damage his/her property. Threats may be oral or written. They may be communicated through regular mail, computer, fax, or phone. These threats may be direct or implied. FORMS OF ASSAULT INCLUDE:  Physically assaulting a person. This includes physical threats to inflict physical harm as well as:  Slapping.  Hitting.  Poking.  Kicking.  Punching.  Pushing.  Arson.  Sabotage.  Equipment vandalism.  Damaging or destroying property.  Throwing or hitting  objects.  Displaying a weapon or an object that appears to be a weapon in a threatening manner.  Carrying a firearm of any kind.  Using a weapon to harm someone.  Using greater physical size/strength to intimidate another.  Making intimidating or threatening gestures.  Bullying.  Hazing.  Intimidating, threatening,  hostile, or abusive language directed toward another person.  It communicates the intention to engage in violence against that person. And it leads a reasonable person to expect that violent behavior may occur.  Stalking another person. IF IT HAPPENS AGAIN:  Immediately call for emergency help (911 in [place]  If someone poses clear and immediate danger to you, seek legal authorities to have a protective or restraining order put in place.  Less threatening assaults can at least be reported to authorities. STEPS TO TAKE IF A SEXUAL ASSAULT HAS HAPPENED  Go to an area of safety. This may include a shelter or staying with a friend. Stay away from the area where you have been attacked. A large percentage of sexual assaults are caused by a friend, relative or associate.  If medications were given by your caregiver, take them as directed for the full length of time prescribed.  Only take over-the-counter or prescription medicines for pain, discomfort, or fever as directed by your caregiver.  If you have come in contact with a sexual disease, find out if you are to be tested again. If your caregiver is concerned about the HIV/AIDS virus, he/she may require you to have continued testing for several months.  For the protection of your privacy, test results can not be given over the phone. Make sure you receive the results of your test. If your test results are not back during your visit, make an appointment with your caregiver to find out the results. Do not assume everything is normal if you have not heard from your caregiver or the medical facility. It is important for you to follow up on all of your test results.  File appropriate papers with authorities. This is important in all assaults, even if it has occurred in a family or by a friend. SEEK MEDICAL CARE IF:  You have new problems because of your injuries.  You have problems that may be because of the medicine you are taking, such  as:  Rash.  Itching.  Swelling.  Trouble breathing.  You develop belly (abdominal) pain, feel sick to your stomach (nausea) or are vomiting.  You begin to run a temperature.  You need supportive care or referral to a rape crisis center. These are centers with trained personnel who can help you get through this ordeal. SEEK IMMEDIATE MEDICAL CARE IF:  You are afraid of being threatened, beaten, or abused. In [place] call 911.  You receive new injuries related to abuse.  You develop severe pain in any area injured in the assault or have any change in your condition that concerns you.  You faint or lose consciousness.  You develop chest pain or shortness of breath. Document Released: 01/19/2005 Document Revised: 04/13/2011 Document Reviewed: 09/07/2007 [facility] Patient Information 2015 [place], [place]. This information is not intended to replace advice given to you by your health care provider. Make sure you discuss any questions you have with your health care provider.

## 2014-10-31 NOTE — ED Notes (Signed)
Bed: WA21 Expected date:  Expected time:  Means of arrival:  Comments: EMS  

## 2014-10-31 NOTE — ED Notes (Signed)
MD states patient tells him that she is not suicidal and she said that because she was upset at the moment. MD believes patient is safe to go home if desired. Patient is ready to leave at this time.
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
## 2014-11-30 ENCOUNTER — Inpatient Hospital Stay (HOSPITAL_COMMUNITY)
Admission: AD | Admit: 2014-11-30 | Discharge: 2014-11-30 | Disposition: A | Discharge status: Home / Self Care | Payer: Medicaid Other | Source: Ambulatory Visit | Attending: Obstetrics | Admitting: Obstetrics

## 2014-11-30 ENCOUNTER — Inpatient Hospital Stay (HOSPITAL_COMMUNITY): Payer: Medicaid Other

## 2014-11-30 ENCOUNTER — Encounter (HOSPITAL_COMMUNITY): Payer: Self-pay

## 2014-11-30 DIAGNOSIS — O99331 Smoking (tobacco) complicating pregnancy, first trimester: Secondary | ICD-10-CM | POA: Diagnosis not present

## 2014-11-30 DIAGNOSIS — O99321 Drug use complicating pregnancy, first trimester: Secondary | ICD-10-CM | POA: Diagnosis not present

## 2014-11-30 DIAGNOSIS — F17210 Nicotine dependence, cigarettes, uncomplicated: Secondary | ICD-10-CM | POA: Diagnosis not present

## 2014-11-30 DIAGNOSIS — O210 Mild hyperemesis gravidarum: Secondary | ICD-10-CM | POA: Diagnosis present

## 2014-11-30 DIAGNOSIS — R102 Pelvic and perineal pain: Secondary | ICD-10-CM | POA: Diagnosis not present

## 2014-11-30 DIAGNOSIS — O26891 Other specified pregnancy related conditions, first trimester: Secondary | ICD-10-CM

## 2014-11-30 DIAGNOSIS — A5901 Trichomonal vulvovaginitis: Secondary | ICD-10-CM | POA: Diagnosis not present

## 2014-11-30 DIAGNOSIS — O98311 Other infections with a predominantly sexual mode of transmission complicating pregnancy, first trimester: Secondary | ICD-10-CM | POA: Insufficient documentation

## 2014-11-30 DIAGNOSIS — Z3A01 Less than 8 weeks gestation of pregnancy: Secondary | ICD-10-CM | POA: Insufficient documentation

## 2014-11-30 DIAGNOSIS — O219 Vomiting of pregnancy, unspecified: Secondary | ICD-10-CM | POA: Diagnosis not present

## 2014-11-30 LAB — URINALYSIS, ROUTINE W REFLEX MICROSCOPIC
BILIRUBIN URINE: NEGATIVE
Glucose, UA: NEGATIVE mg/dL
KETONES UR: NEGATIVE mg/dL
LEUKOCYTES UA: NEGATIVE
NITRITE: POSITIVE — AB
PROTEIN: NEGATIVE mg/dL
Specific Gravity, Urine: 1.025 (ref 1.005–1.030)
UROBILINOGEN UA: 1 mg/dL (ref 0.0–1.0)
pH: 6.5 (ref 5.0–8.0)

## 2014-11-30 LAB — URINE MICROSCOPIC-ADD ON

## 2014-11-30 LAB — CBC
HEMATOCRIT: 31.1 % — AB (ref 36.0–46.0)
Hemoglobin: 10.3 g/dL — ABNORMAL LOW (ref 12.0–15.0)
MCH: 30.1 pg (ref 26.0–34.0)
MCHC: 33.1 g/dL (ref 30.0–36.0)
MCV: 90.9 fL (ref 78.0–100.0)
PLATELETS: 238 10*3/uL (ref 150–400)
RBC: 3.42 MIL/uL — ABNORMAL LOW (ref 3.87–5.11)
RDW: 15.8 % — AB (ref 11.5–15.5)
WBC: 5.1 10*3/uL (ref 4.0–10.5)

## 2014-11-30 LAB — WET PREP, GENITAL
Clue Cells Wet Prep HPF POC: NONE SEEN
Yeast Wet Prep HPF POC: NONE SEEN

## 2014-11-30 LAB — HCG, QUANTITATIVE, PREGNANCY: hCG, Beta Chain, Quant, S: 43045 m[IU]/mL — ABNORMAL HIGH (ref <5)

## 2014-11-30 LAB — POCT PREGNANCY, URINE: PREG TEST UR: POSITIVE — AB

## 2014-11-30 LAB — RAPID URINE DRUG SCREEN, HOSP PERFORMED
Amphetamines: NOT DETECTED
BENZODIAZEPINES: NOT DETECTED
Barbiturates: NOT DETECTED
COCAINE: POSITIVE — AB
OPIATES: NOT DETECTED
TETRAHYDROCANNABINOL: POSITIVE — AB

## 2014-11-30 MED ORDER — METRONIDAZOLE 500 MG PO TABS
2000.0000 mg | ORAL_TABLET | Freq: Once | ORAL | Status: AC
  Administered 2014-11-30: 2000 mg via ORAL
  Filled 2014-11-30: qty 4

## 2014-11-30 MED ORDER — PROMETHAZINE HCL 25 MG/ML IJ SOLN
25.0000 mg | Freq: Once | INTRAMUSCULAR | Status: AC
  Administered 2014-11-30: 25 mg via INTRAVENOUS
  Filled 2014-11-30: qty 1

## 2014-11-30 MED ORDER — LACTATED RINGERS IV BOLUS (SEPSIS)
1000.0000 mL | Freq: Once | INTRAVENOUS | Status: AC
  Administered 2014-11-30: 1000 mL via INTRAVENOUS

## 2014-11-30 MED ORDER — NIFEDIPINE 10 MG PO CAPS: 10.0000 mg | ORAL_CAPSULE | Freq: Once | ORAL | Status: DC

## 2014-11-30 MED ORDER — PROMETHAZINE HCL 25 MG PO TABS: 12.5000 mg | ORAL_TABLET | Freq: Four times a day (QID) | ORAL | Status: DC | PRN

## 2014-11-30 NOTE — MAU Provider Note (Signed)
Chief Complaint: Emesis   First Provider Initiated Contact with Patient 11/30/14 1114      SUBJECTIVE HPI: Julia Cain is a 24 y.o. [id number] at [id number] by LMP who presents to maternity admissions reporting nausea/vomiting with pregnancy and abdominal and back pain, all with onset in the last week.  She reports she has not kept down any food or fluids for 2-3 days.  She is asleep at onset of exam but wakes easily and answers questions appropriately. She denies vaginal bleeding, vaginal itching/burning, urinary symptoms, h/a, dizziness, n/v, or fever/chills.     Emesis  This is a new problem. The current episode started in the past 7 days. The problem occurs 5 to 10 times per day. The problem has been unchanged. The emesis has an appearance of stomach contents. There has been no fever. Associated symptoms include abdominal pain and dizziness. Pertinent negatives include no chest pain, chills, diarrhea, fever or headaches. She has tried nothing for the symptoms.  Abdominal Pain This is a new problem. The current episode started in the past 7 days. The onset quality is gradual. The problem occurs intermittently. The problem has been waxing and waning. The pain is located in the LLQ and RLQ. The pain is moderate. The quality of the pain is cramping. The abdominal pain radiates to the back. Associated symptoms include nausea and vomiting. Pertinent negatives include no constipation, diarrhea, dysuria, fever, frequency or headaches. Nothing aggravates the pain. The pain is relieved by nothing. She has tried nothing for the symptoms.    Past Medical History  Diagnosis Date  . No pertinent past medical history   . PIH (pregnancy induced hypertension)   . Asthma   . Pregnancy induced hypertension   . Trichomonas   . Miscarriage within last 12 months    Past Surgical History  Procedure Laterality Date  . Dilation and curettage of uterus    . Induced abortion    . Cesarean section N/A 04/13/2014     Procedure: VAGINAL DELIVERY ;  Surgeon: [name], MD;  Location: [facility];  Service: Obstetrics;  Laterality: N/A;   Social History   Social History  . Marital Status: Single    Spouse Name: N/A  . Number of Children: N/A  . Years of Education: N/A   Occupational History  . Not on file.   Social History Main Topics  . Smoking status: Current Every Day Smoker -- 0.25 packs/day    Types: Cigarettes  . Smokeless tobacco: Never Used  . Alcohol Use: No  . Drug Use: Yes    Special: Marijuana     Comment: currently smokes THC occassionaly  . Sexual Activity: Yes    Birth Control/ Protection: None   Other Topics Concern  . Not on file   Social History Narrative   No current facility-administered medications on file prior to encounter.   Current Outpatient Prescriptions on File Prior to Encounter  Medication Sig Dispense Refill  . ARIPiprazole (ABILIFY) 5 MG tablet Take 5 mg by mouth daily.    . butalbital-acetaminophen-caffeine (FIORICET WITH CODEINE) 50-325-40-30 MG capsule Take 1 capsule by mouth every 4 (four) hours as needed for headache.    . Iron-FA-B Cmp-C-Biot-Probiotic (FUSION PLUS) CAPS Take 1 capsule by mouth daily before breakfast. (Patient not taking: Reported on 10/31/2014) 30 capsule 5  . labetalol (NORMODYNE) 200 MG tablet Take 200 mg by mouth 2 (two) times daily.    . naproxen (NAPROSYN) 375 MG tablet Take 1 tablet (375 mg  total) by mouth 2 (two) times daily. (Patient not taking: Reported on 11/30/2014) 20 tablet 0  . oxyCODONE-acetaminophen (PERCOCET/ROXICET) 5-325 MG per tablet Take 1-2 tablets by mouth every 4 (four) hours as needed for moderate pain or severe pain (for pain scale equal to or greater than 7). (Patient not taking: Reported on 10/31/2014) 40 tablet 0   Allergies  Allergen Reactions  . Shellfish Allergy Itching, Swelling and Rash    ROS:  Review of Systems  Constitutional: Negative for fever, chills and fatigue.  HENT: Negative for  sinus pressure.   Eyes: Negative for photophobia.  Respiratory: Negative for shortness of breath.   Cardiovascular: Negative for chest pain.  Gastrointestinal: Positive for nausea, vomiting and abdominal pain. Negative for diarrhea and constipation.  Genitourinary: Negative for dysuria, frequency, flank pain, vaginal bleeding, vaginal discharge, difficulty urinating, vaginal pain and pelvic pain.  Musculoskeletal: Negative for neck pain.  Neurological: Positive for dizziness. Negative for weakness and headaches.  Psychiatric/Behavioral: Negative.      I have reviewed patient's Past Medical Hx, Surgical Hx, Family Hx, Social Hx, medications and allergies.   Physical Exam   Patient Vitals for the past 24 hrs:  BP Temp Temp src Pulse Resp Height Weight  11/30/14 1440 108/73 mmHg - - 64 - - -  11/30/14 1003 103/56 mmHg 98.7 F (37.1 C) Oral 68 18  (1.575 m) 49.351 kg (108 lb 12.8 oz)   Constitutional: Well-developed, well-nourished female in no acute distress.  Cardiovascular: normal rate Respiratory: normal effort GI: Abd soft, non-tender. Pos BS x 4 MS: Extremities nontender, no edema, normal ROM Neurologic: Alert and oriented x 4.  GU: Neg CVAT.  PELVIC EXAM: Cervix pink, visually closed, without lesion, scant thin white discharge, vaginal walls and external genitalia normal Bimanual exam: Cervix 0/long/high, firm, anterior, neg CMT, uterus nontender, ~7 week size, adnexa without tenderness, enlargement, or mass   LAB RESULTS Results for orders placed or performed during the hospital encounter of 11/30/14 (from the past 24 hour(s))  Urinalysis, Routine w reflex microscopic (not at [facility])     Status: Abnormal   Collection Time: 11/30/14 10:10 AM  Result Value Ref Range   Color, Urine YELLOW YELLOW   APPearance CLEAR CLEAR   Specific Gravity, Urine 1.025 1.005 - 1.030   pH 6.5 5.0 - 8.0   Glucose, UA NEGATIVE NEGATIVE mg/dL   Hgb urine dipstick MODERATE (A) NEGATIVE    Bilirubin Urine NEGATIVE NEGATIVE   Ketones, ur NEGATIVE NEGATIVE mg/dL   Protein, ur NEGATIVE NEGATIVE mg/dL   Urobilinogen, UA 1.0 0.0 - 1.0 mg/dL   Nitrite POSITIVE (A) NEGATIVE   Leukocytes, UA NEGATIVE NEGATIVE  Urine microscopic-add on     Status: Abnormal   Collection Time: 11/30/14 10:10 AM  Result Value Ref Range   Squamous Epithelial / LPF RARE RARE   WBC, UA 3-6 <3 WBC/hpf   RBC / HPF 0-2 <3 RBC/hpf   Bacteria, UA MANY (A) RARE  Urine rapid drug screen (hosp performed)     Status: Abnormal   Collection Time: 11/30/14 10:10 AM  Result Value Ref Range   Opiates NONE DETECTED NONE DETECTED   Cocaine POSITIVE (A) NONE DETECTED   Benzodiazepines NONE DETECTED NONE DETECTED   Amphetamines NONE DETECTED NONE DETECTED   Tetrahydrocannabinol POSITIVE (A) NONE DETECTED   Barbiturates NONE DETECTED NONE DETECTED  Pregnancy, urine POC     Status: Abnormal   Collection Time: 11/30/14 10:12 AM  Result Value Ref Range   Preg  Test, Ur POSITIVE (A) NEGATIVE  CBC     Status: Abnormal   Collection Time: 11/30/14 10:55 AM  Result Value Ref Range   WBC 5.1 4.0 - 10.5 K/uL   RBC 3.42 (L) 3.87 - 5.11 MIL/uL   Hemoglobin 10.3 (L) 12.0 - 15.0 g/dL   HCT 57.8 (L) 46.9 - 62.9 %   MCV 90.9 78.0 - 100.0 fL   MCH 30.1 26.0 - 34.0 pg   MCHC 33.1 30.0 - 36.0 g/dL   RDW 52.8 (H) 41.3 - 24.4 %   Platelets 238 150 - 400 K/uL  hCG, quantitative, pregnancy     Status: Abnormal   Collection Time: 11/30/14 10:55 AM  Result Value Ref Range   hCG, Beta Chain, Quant, S 43045 (H) <5 mIU/mL  Wet prep, genital     Status: Abnormal   Collection Time: 11/30/14 11:15 AM  Result Value Ref Range   Yeast Wet Prep HPF POC NONE SEEN NONE SEEN   Trich, Wet Prep FEW (A) NONE SEEN   Clue Cells Wet Prep HPF POC NONE SEEN NONE SEEN   WBC, Wet Prep HPF POC FEW (A) NONE SEEN    --/--/A POS (03/10 2140)  IMAGING US Ob Comp Less 14 Wks  11/30/2014  CLINICAL DATA:  Patient with pelvic pain.  Nausea and vomiting.  EXAM: OBSTETRIC <14 WK ULTRASOUND TECHNIQUE: Transabdominal ultrasound was performed for evaluation of the gestation as well as the maternal uterus and adnexal regions. COMPARISON:  Pelvic radiograph 10/31/2014 FINDINGS: Intrauterine gestational sac: Visualized/normal in shape. Yolk sac:  Present Embryo:  Present Cardiac Activity: Present Heart Rate: 115 bpm CRL:   7.1  mm   6 w 4 d                  [place] EDC: 07/22/2015 Maternal uterus/adnexae: Normal right and left ovaries. No subchorionic hemorrhage. Small amount of fluid the pelvis. IMPRESSION: Single live intrauterine gestation 7 weeks 0 days by LMP. Small amount of fluid the pelvis. Electronically Signed   By: Annia Belt M.D.   On: 11/30/2014 13:09    MAU Management/MDM: Ordered labs and reviewed results.  IUP confirmed on ultrasound.  Pt appeared sedated during ultrasound, then became combative when woken up.  Ultrasound tech unable to perform vaginal ultrasound because pt not cooperative.  UDS ordered and positive for cocaine.  Pt denies cocaine use and reports she stopped smoking marijuana 2 weeks ago.  Pt positive today for trichomonas.  Pt plans prenatal care with Dr Gaynell Face.  Treatments in MAU included Flagyl 2 g PO x 1 dose. Rx for Phenergan PO for nausea.  Pt stable at time of discharge.  ASSESSMENT 1. Nausea and vomiting during pregnancy prior to [redacted] weeks gestation   2. Pelvic pain affecting pregnancy in first trimester, antepartum   3. 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 Trichomonas vaginalis (TV) infection     PLAN Discharge home Rx for Phenergan  Partner Rx written for expedited partner therapy   Follow-up Information    Follow up with Kathreen Cosier, MD.   Specialty:  Obstetrics and Gynecology   Why:  Start prenatal care as soon as possible   Contact information:   [address] [place] [address] [phone]       Sharen Counter Certified Nurse-Midwife 11/30/2014  2:56  PM

## 2014-11-30 NOTE — MAU Note (Signed)
Pt C/O vomiting for the last 48 hours, hasn't been able to hold anything down.  Denies diarrhea.  C/O HA & back pain.  Pos HPT on Sunday.

## 2014-11-30 NOTE — MAU Note (Signed)
Pt called, not in lobby 

## 2014-11-30 NOTE — Discharge Instructions (Signed)
Trichomoniasis Trichomoniasis is an infection caused by an organism called Trichomonas. The infection can affect both women and men. In women, the outer female genitalia and the vagina are affected. In men, the penis is mainly affected, but the prostate and other reproductive organs can also be involved. Trichomoniasis is a sexually transmitted infection (STI) and is most often passed to another person through sexual contact.  RISK FACTORS  Having unprotected sexual intercourse.  Having sexual intercourse with an infected partner. SIGNS AND SYMPTOMS  Symptoms of trichomoniasis in women include:  Abnormal gray-green frothy vaginal discharge.  Itching and irritation of the vagina.  Itching and irritation of the area outside the vagina. Symptoms of trichomoniasis in men include:   Penile discharge with or without pain.  Pain during urination. This results from inflammation of the urethra. DIAGNOSIS  Trichomoniasis may be found during a Pap test or physical exam. Your health care provider may use one of the following methods to help diagnose this infection:  Testing the pH of the vagina with a test tape.  Using a vaginal swab test that checks for the Trichomonas organism. A test is available that provides results within a few minutes.  Examining a urine sample.  Testing vaginal secretions. Your health care provider may test you for other STIs, including HIV. TREATMENT   You may be given medicine to fight the infection. Women should inform their health care provider if they could be or are pregnant. Some medicines used to treat the infection should not be taken during pregnancy.  Your health care provider may recommend over-the-counter medicines or creams to decrease itching or irritation.  Your sexual partner will need to be treated if infected.  Your health care provider may test you for infection again 3 months after treatment. HOME CARE INSTRUCTIONS   Take medicines only as  directed by your health care provider.  Take over-the-counter medicine for itching or irritation as directed by your health care provider.  Do not have sexual intercourse while you have the infection.  Women should not douche or wear tampons while they have the infection.  Discuss your infection with your partner. Your partner may have gotten the infection from you, or you may have gotten it from your partner.  Have your sex partner get examined and treated if necessary.  Practice safe, informed, and protected sex.  See your health care provider for other STI testing. SEEK MEDICAL CARE IF:   You still have symptoms after you finish your medicine.  You develop abdominal pain.  You have pain when you urinate.  You have bleeding after sexual intercourse.  You develop a rash.  Your medicine makes you sick or makes you throw up (vomit). MAKE SURE YOU:  Understand these instructions.  Will watch your condition.  Will get help right away if you are not doing well or get worse.   This information is not intended to replace advice given to you by your health care provider. Make sure you discuss any questions you have with your health care provider.   Document Released: 07/15/2000 Document Revised: 02/09/2014 Document Reviewed: 10/31/2012 Elsevier Interactive Patient Education 2016 [employer]  Morning Sickness Morning sickness is when you feel sick to your stomach (nauseous) during pregnancy. This nauseous feeling may or may not come with vomiting. It often occurs in the morning but can be a problem any time of day. Morning sickness is most common during the first trimester, but it may continue throughout pregnancy. While morning sickness is  unpleasant, it is usually harmless unless you develop severe and continual vomiting (hyperemesis gravidarum). This condition requires more intense treatment.  CAUSES  The cause of morning sickness is not completely known but seems to be  related to normal hormonal changes that occur in pregnancy. RISK FACTORS You are at greater risk if you:  Experienced nausea or vomiting before your pregnancy.  Had morning sickness during a previous pregnancy.  Are pregnant with more than one baby, such as twins. TREATMENT  Do not use any medicines (prescription, over-the-counter, or herbal) for morning sickness without first talking to your health care provider. Your health care provider may prescribe or recommend:  Vitamin B6 supplements.  Anti-nausea medicines.  The herbal medicine ginger. HOME CARE INSTRUCTIONS   Only take over-the-counter or prescription medicines as directed by your health care provider.  Taking multivitamins before getting pregnant can prevent or decrease the severity of morning sickness in most women.  Eat a piece of dry toast or unsalted crackers before getting out of bed in the morning.  Eat five or six small meals a day.  Eat dry and bland foods (rice, baked potato). Foods high in carbohydrates are often helpful.  Do not drink liquids with your meals. Drink liquids between meals.  Avoid greasy, fatty, and spicy foods.  Get someone to cook for you if the smell of any food causes nausea and vomiting.  If you feel nauseous after taking prenatal vitamins, take the vitamins at night or with a snack.  Snack on protein foods (nuts, yogurt, cheese) between meals if you are hungry.  Eat unsweetened gelatins for desserts.  Wearing an acupressure wristband (worn for sea sickness) may be helpful.  Acupuncture may be helpful.  Do not smoke.  Get a humidifier to keep the air in your house free of odors.  Get plenty of fresh air. SEEK MEDICAL CARE IF:   Your home remedies are not working, and you need medicine.  You feel dizzy or lightheaded.  You are losing weight. SEEK IMMEDIATE MEDICAL CARE IF:   You have persistent and uncontrolled nausea and vomiting.  You pass out (faint). MAKE SURE  YOU:  Understand these instructions.  Will watch your condition.  Will get help right away if you are not doing well or get worse.   This information is not intended to replace advice given to you by your health care provider. Make sure you discuss any questions you have with your health care provider.   Document Released: 03/12/2006 Document Revised: 01/24/2013 Document Reviewed: 07/06/2012 Elsevier Interactive Patient Education 2016 [employer]  Expedited Partner Therapy:  Information Sheet for Patients and Partners               You have been offered expedited partner therapy (EPT). This information sheet contains important information and warnings you need to be aware of, so please read it carefully.   Expedited Partner Therapy (EPT) is the clinical practice of treating the sexual partners of persons who receive chlamydia, gonorrhea, or trichomoniasis diagnoses by providing medications or prescriptions to the patient. Patients then provide partners with these therapies without the health-care provider having examined the partner. In other words, EPT is a convenient, fast and private way for patients to help their sexual partners get treated.   Chlamydia and gonorrhea are bacterial infections you get from having sex with a person who is already infected. Trichomoniasis (or trich) is a very common sexually transmitted infection (STI) that is caused by infection with a protozoan  parasite called Trichomonas vaginalis.  Many people with these infections dont know it because they feel fine, but without treatment these infections can cause serious health problems, such as pelvic inflammatory disease, ectopic pregnancy, infertility and increased risk of HIV.   It is important to get treated as soon as possible to protect your health, to avoid spreading these infections to others, and to prevent yourself from becoming re-infected. The good news is these infections can be easily cured with  proper antibiotic medicine. The best way to take care of your self is to see a doctor or go to your local health department. If you are not able to see a doctor or other medical provider, you should take EPT.    Recommended Medication: EPT for Chlamydia:  Azithromycin (Zithromax) 1 gram orally in a single dose EPT for Gonorrhea:  Cefixime (Suprax) 400 milligrams orally in a single dose PLUS azithromycin (Zithromax) 1 gram orally in a single dose EPT for Trichomoniasis:  Metronidazole (Flagyl) 2 grams orally in a single dose   These medicines are very safe. However, you should not take them if you have ever had an allergic reaction (like a rash) to any of these medicines: azithromycin (Zithromax), erythromycin, clarithromycin (Biaxin), metronidazole (Flagyl), tinidazole (Tindimax). If you are uncertain about whether you have an allergy, call your medical provider or pharmacist before taking this medicine. If you have a serious, long-term illness like kidney, liver or heart disease, colitis or stomach problems, or you are currently taking other prescription medication, talk to your provider before taking this medication.   Women: If you have lower belly pain, pain during sex, vomiting, or a fever, do not take this medicine. Instead, you should see a medical provider to be certain you do not have pelvic inflammatory disease (PID). PID can be serious and lead to infertility, pregnancy problems or chronic pelvic pain.   Pregnant Women: It is very important for you to see a doctor to get pregnancy services and pre-natal care. These antibiotics for EPT are safe for pregnant women, but you still need to see a medical provider as soon as possible. It is also important to note that Doxycycline is an alternative therapy for chlamydia, but it should not be taken by someone who is pregnant.   Men: If you have pain or swelling in the testicles or a fever, do not take this medicine and see a medical provider.      Men who have sex with men (MSM): MSM in [place] continue to experience high rates of syphilis and HIV. Many MSM with gonorrhea or chlamydia could also have syphilis and/or HIV and not know it. If you are a man who has sex with other men, it is very important that you see a medical provider and are tested for HIV and syphilis. EPT is not recommended for gonorrhea for MSM.  Recommended treatment for gonorrhea for MSM is Rocephin (shot) AND azithromycin due to decreased cure rate.  Please see your medical provider if this is the case.    Along with this information sheet is a prescription for the medicine. If you receive a prescription it will be in your name and will indicate your date of birth, or it will be in the name of Expedited Partner Therapy.   In either case, you can have the prescription filled at a pharmacy. You will be responsible for the cost of the medicine, unless you have prescription drug coverage. In that case, you could provide your name  so the pharmacy could bill your health plan.   Take the medication as directed. Some people will have a mild, upset stomach, which does not last long. AVOID alcohol 24 hours after taking metronidazole (Flagyl) to reduce the possibility of a disulfiram-like reaction (severe vomiting and abdominal pain).  After taking the medicine, do not have sex for 7 days. Do not share this medicine or give it to anyone else. It is important to tell everyone you have had sex with in the last 60 days that they need to go and get tested for sexually transmitted infections.   Ways to prevent these and other sexually transmitted infections (STIs):    Abstain from sex. This is the only sure way to avoid getting an STI.   Use barrier methods, such as condoms, consistently and correctly.   Limit the number of sexual partners.   Have regular physical exams, including testing for STIs.   For more information about EPT or other issues pertaining to an STI,  please contact your medical provider or the [facility] Department at [phone] or [email or web address]

## 2014-12-01 LAB — HIV ANTIBODY (ROUTINE TESTING W REFLEX): HIV Screen 4th Generation wRfx: NONREACTIVE

## 2014-12-02 LAB — CULTURE, OB URINE

## 2014-12-03 ENCOUNTER — Telehealth: Payer: Self-pay | Admitting: Advanced Practice Midwife

## 2014-12-03 LAB — GC/CHLAMYDIA PROBE AMP (~~LOC~~) NOT AT ARMC
Chlamydia: NEGATIVE
NEISSERIA GONORRHEA: NEGATIVE

## 2014-12-03 MED ORDER — CEPHALEXIN 500 MG PO CAPS: 500.0000 mg | ORAL_CAPSULE | Freq: Four times a day (QID) | ORAL | Status: DC

## 2014-12-03 NOTE — Telephone Encounter (Signed)
Pt returned call.  Notified of UTI and Rx at pharmacy.  Answered pt questions.

## 2014-12-03 NOTE — Telephone Encounter (Signed)
Pt has positive urine culture for Ecoli.  Keflex 500 mg QID sent to pharmacy.  Left message for pt to call about lab results.

## 2014-12-09 ENCOUNTER — Telehealth: Payer: Self-pay | Admitting: Advanced Practice Midwife

## 2014-12-09 NOTE — Telephone Encounter (Signed)
Pt called reporting seeing a streak of blood in emesis today. Has been seen in [facility] for N/V of pregnancy. Sx adequately controlled by Phenergan, but pt was very nervous about seeing blood today because she has had high risk pregnancies in the past (twins, second trimester IUFD). Reassured her that this is not dangerous, but [facility] amount of blood in emesis andcontinue taking Phenergan PRN to control vomiting. [facility] Dr[facility] Gaynell Face[facility]arshall 12/12/14.

## 2015-01-04 IMAGING — US US OB FOLLOW-UP
1 series · 14 of 28 positions shown · non-contrast
Comparison: none

[Series 1: us ob follow-up · 0.23mm/px · 14 of 73 slices shown]
[im 3/73]
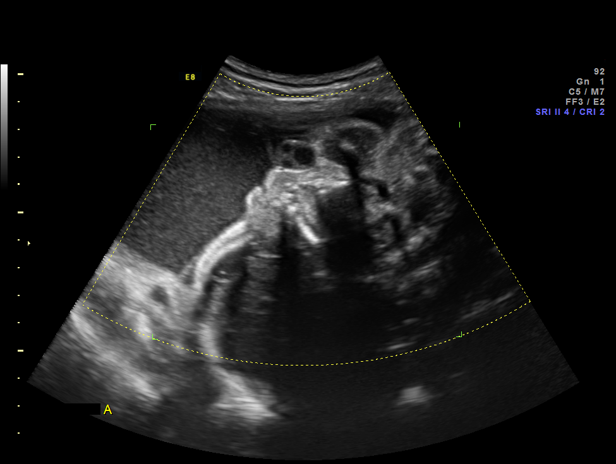
[im 9/73]
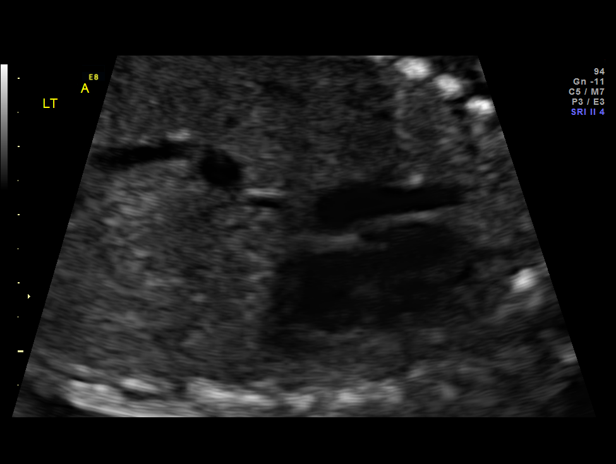
[im 14/73]
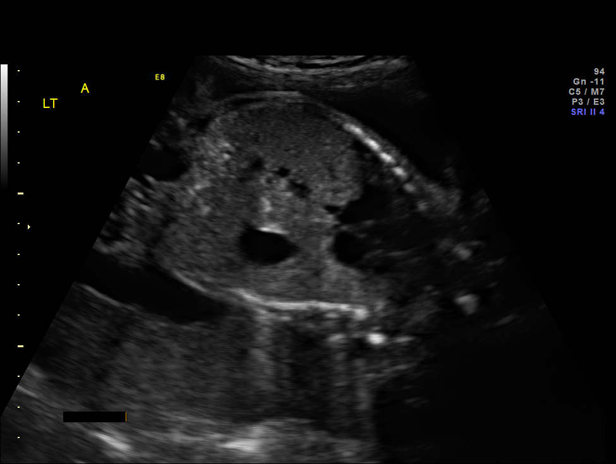
[im 19/73]
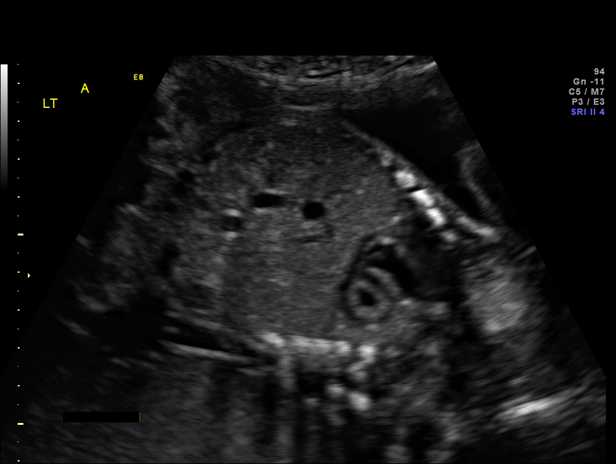
[im 25/73]
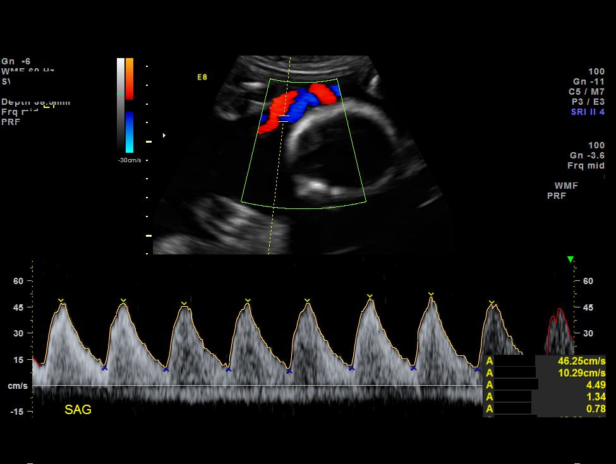
[im 30/73]
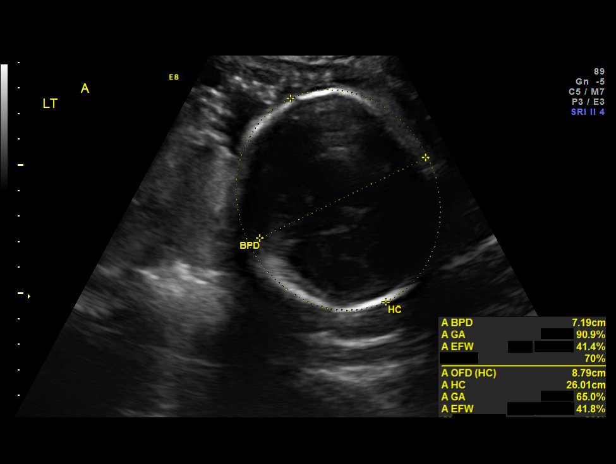
[im 35/73]
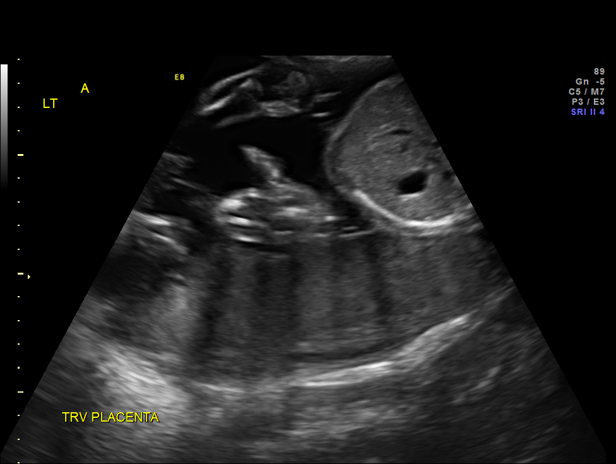
[im 41/73]
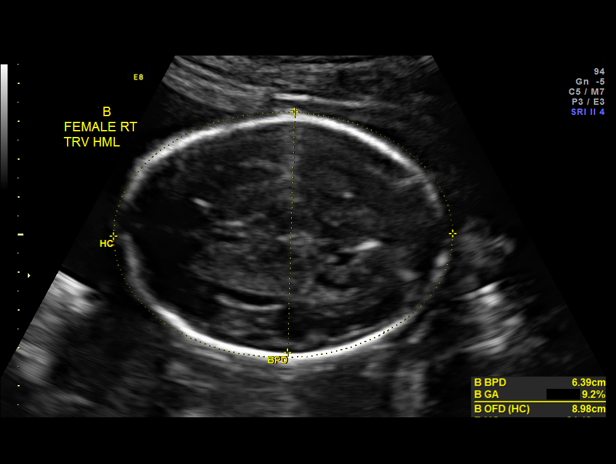
[im 46/73]
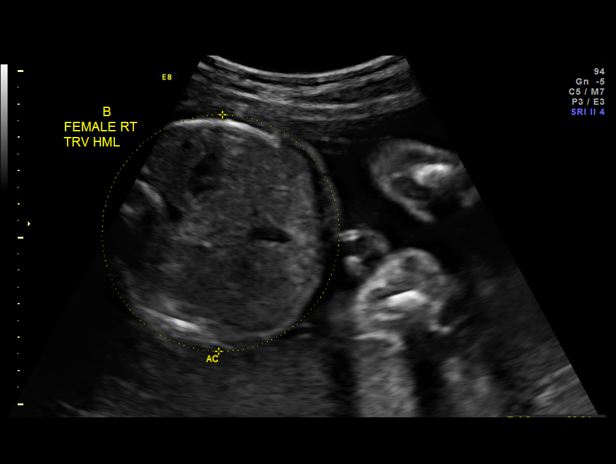
[im 51/73]
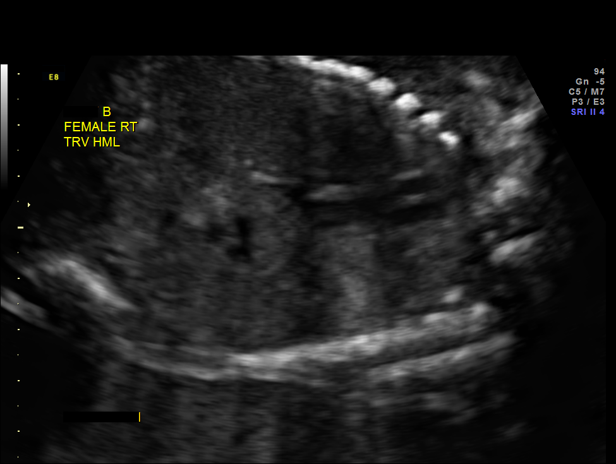
[im 57/73]
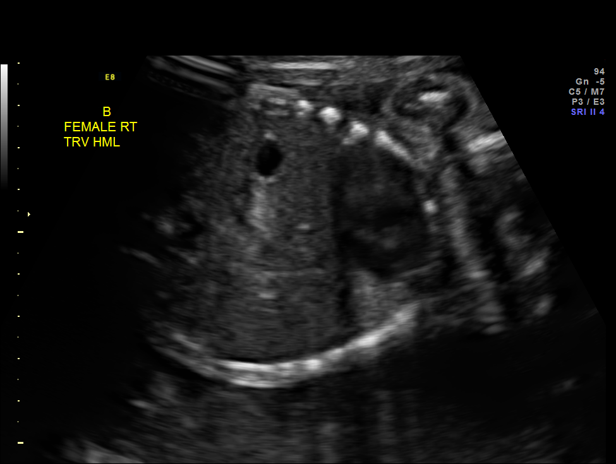
[im 62/73]
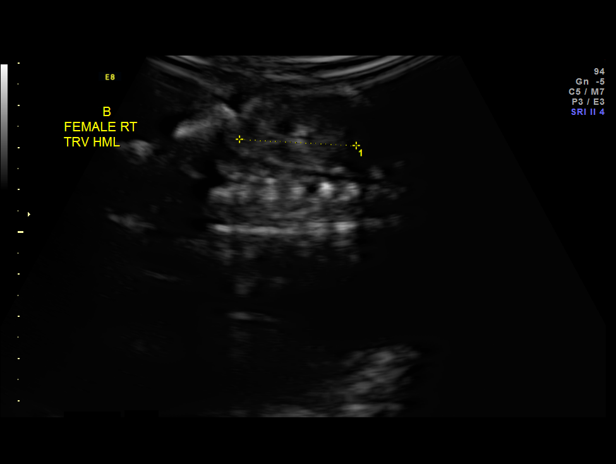
[im 67/73]
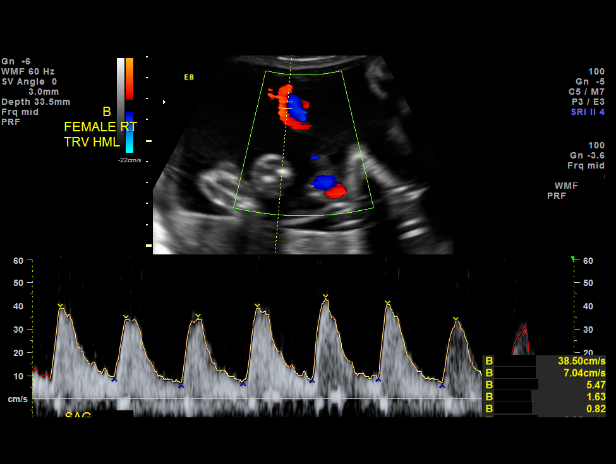
[im 73/73]
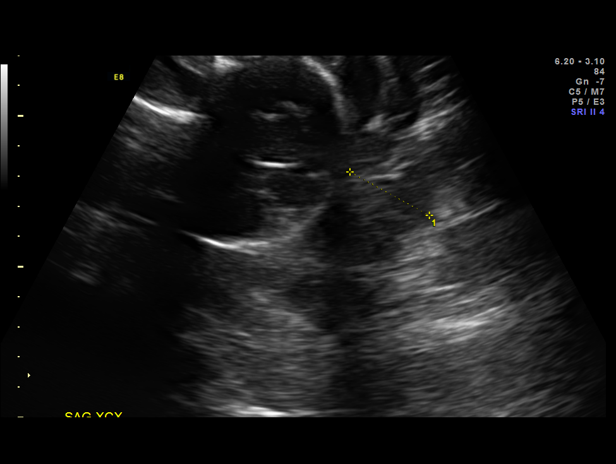

[14 of 28 positions shown; findings below may reference images not displayed]

OBSTETRICS REPORT
                    (Corrected Final 02/06/2014 [DATE])

Service(s) Provided

 US OB FOLLOW UP                                       76816.1
 US OB FOLLOW UP ADDL GEST                             76816.2
 US UA DOPPLER RE-EVAL                                 76828.1
 US UA DOPPLER ADDL GEST RE EVAL                       76828.2
Indications

 27 weeks gestation of pregnancy
 Fetal abnormality - other known or suspected
 (pericardial effusion) - Twin B; resolved
 Poor obstetric history: Previous gestational HTN
 Poor obstetric history: Previous midtrimester loss
 (18 week demise)
 Perinatal infection, Parvovirus
 Twin gestation, Di-Di
Fetal Evaluation (Fetus A)

 Num Of Fetuses:    2
 Fetal Heart Rate:  137                          bpm
 Cardiac Activity:  Observed
 Fetal Lie:         Left Fetus
 Presentation:      Cephalic
 Placenta:          Posterior, above cervical
                    os
 P. Cord            Previously Visualized
 Insertion:

 Membrane Desc:     Dividing
                    Membrane seen
                    - Dichorionic.

 Amniotic Fluid
 AFI FV:      Subjectively within normal limits
                                             Larg Pckt:     5.3  cm
Biometry (Fetus A)

 BPD:     71.5  mm     G. Age:  28w 5d                CI:         81.4   70 - 86
 OFD:     87.8  mm                                    FL/HC:      19.2   18.6 -

 HC:     257.9  mm     G. Age:  28w 0d       56  %    HC/AC:      1.22   1.05 -

 AC:       211  mm     G. Age:  25w 4d        9  %    FL/BPD:     69.4   71 - 87
 FL:      49.6  mm     G. Age:  26w 5d       28  %    FL/AC:      23.5   20 - 24

 Est. FW:     934  gm      2 lb 1 oz     41  %     FW Discordancy      0 \ 8 %
Gestational Age (Fetus A)

 LMP:           27w 0d        Date:  07/27/13                 EDD:   05/03/14
 U/S Today:     27w 2d                                        EDD:   05/01/14
 Best:          27w 0d     Det. By:  LMP  (07/27/13)          EDD:   05/03/14
Anatomy (Fetus A)

 Cranium:          Appears normal         Aortic Arch:      Previously seen
 Fetal Cavum:      Previously seen        Ductal Arch:      Previously seen
 Ventricles:       Previously seen        Diaphragm:        Appears normal
 Choroid Plexus:   Previously seen        Stomach:          Appears normal, left
                                                            sided
 Cerebellum:       Previously seen        Abdomen:          Previously seen
 Posterior Fossa:  Previously seen        Abdominal Wall:   Previously seen
 Nuchal Fold:      Previously seen        Cord Vessels:     Previously seen
 Face:             Orbits and profile     Kidneys:          Appear normal
                   previously seen
 Lips:             Previously seen        Bladder:          Appears normal
 Heart:            Appears normal         Spine:            Previously seen
                   (4CH, axis, and
                   situs)
 RVOT:             Appears normal         Lower             Previously seen
                                          Extremities:
 LVOT:             Appears normal         Upper             Previously seen
                                          Extremities:

 Other:  Male gender previously seen. Heels and 5th digit previously
         visualized. Nasal bone previously visualized.
Doppler - Fetal Vessels (Fetus A)

 Umbilical Artery
 S/D:   4.87       > 97.5  %tile       RI:
 PI:    1.42                           PSV:       46.25   cm/s

Fetal Evaluation (Fetus B)

 Num Of Fetuses:    2
 Fetal Heart Rate:  133                          bpm
 Cardiac Activity:  Observed
 Fetal Lie:         Right Fetus
 Presentation:      Transverse, head to
                    maternal left
 Placenta:          Posterior, above cervical
                    os
 P. Cord            Previously Visualized
 Insertion:

 Membrane Desc:     Dividing
                    Membrane seen
                    - Dichorionic.

 Amniotic Fluid
 AFI FV:      Subjectively within normal limits
                                             Larg Pckt:     4.8  cm
Biometry (Fetus B)
 BPD:     63.7  mm     G. Age:  25w 5d                CI:         72.1   70 - 86
 OFD:     88.4  mm                                    FL/HC:      18.9   18.6 -

 HC:     243.2  mm     G. Age:  26w 3d       10  %    HC/AC:      1.12   1.05 -

 AC:     216.3  mm     G. Age:  26w 1d       18  %    FL/BPD:     72.2   71 - 87
 FL:        46  mm     G. Age:  25w 2d        4  %    FL/AC:      21.3   20 - 24

 Est. FW:     857  gm    1 lb 14 oz      28  %     FW Discordancy         8  %
Gestational Age (Fetus B)

 LMP:           27w 0d        Date:  07/27/13                 EDD:   05/03/14
 U/S Today:     25w 6d                                        EDD:   05/11/14
 Best:          27w 0d     Det. By:  LMP  (07/27/13)          EDD:   05/03/14
Anatomy (Fetus B)

 Cranium:          Appears normal         Aortic Arch:      Previously seen
 Fetal Cavum:      Appears normal         Ductal Arch:      Previously seen
 Ventricles:       Appears normal         Diaphragm:        Appears normal
 Choroid Plexus:   Previously seen        Stomach:          Appears normal, left
                                                            sided
 Cerebellum:       Previously seen        Abdomen:          Previously seen
 Posterior Fossa:  Previously seen        Abdominal Wall:   Previously seen
 Nuchal Fold:      Previously seen        Cord Vessels:     Previously seen
 Face:             Orbits and profile     Kidneys:          Appear normal
                   previously seen
 Lips:             Previously seen        Bladder:          Appears normal
 Heart:            Appears normal         Spine:            Previously seen
                   (4CH, axis, and
                   situs)
 RVOT:             Previously seen        Lower             Previously seen
                                          Extremities:
 LVOT:             Previously seen        Upper             Previously seen
                                          Extremities:

 Other:  Female gender previously seen. Heels and 5th digit previously
         visualized. Nasal bone previously visualized.
Doppler - Fetal Vessels (Fetus B)

 Umbilical Artery
 S/D:   4.86       > 97.5  %tile       RI:
 PI:    1.51                           PSV:       41.52   cm/s

Cervix Uterus Adnexa

 Cervical Length:    2.98     cm

 Cervix:       Normal appearance by transabdominal scan.
Impression

 Dichorionic/diamniotic twin pregnancy at 27+0 weeks
 Normal interval anatomy; anatomic survey complete x 2
 Normal amniotic fluid volume x 2
 Twin A:  EFW at the 41st %tile; AC at the 9th %tile
 Twin B: EFW at the 28th %tile; AC at the 18th %tile
 UA dopplers were elevated for both twins
Recommendations

 Weekly BPPs and UA dopplers

                 Attending Physician, BINO

## 2015-02-03 NOTE — L&D Delivery Note (Signed)
Delivery Note At 3:14 PM a viable female was delivered via  (Presentation: ;  ).  APGAR: , ; weight  .   Placenta status: , .  Cord:  with the following complications: .  Cord pH: not done  Anesthesia: Epidural  Episiotomy:   Lacerations:   Suture Repair: none Est. Blood Loss (mL):    Mom to postpartum.  Baby to Couplet care / Skin to Skin.  MARSHALL,BERNARD A 07/19/2015, 3:23 PM

## 2015-02-07 LAB — OB RESULTS CONSOLE HEPATITIS B SURFACE ANTIGEN: Hepatitis B Surface Ag: NEGATIVE

## 2015-02-07 LAB — OB RESULTS CONSOLE ABO/RH: RH TYPE: POSITIVE

## 2015-02-07 LAB — OB RESULTS CONSOLE GC/CHLAMYDIA
CHLAMYDIA, DNA PROBE: NEGATIVE
GC PROBE AMP, GENITAL: NEGATIVE

## 2015-02-07 LAB — OB RESULTS CONSOLE RPR: RPR: NONREACTIVE

## 2015-02-07 LAB — OB RESULTS CONSOLE ANTIBODY SCREEN: ANTIBODY SCREEN: NEGATIVE

## 2015-02-07 LAB — OB RESULTS CONSOLE HIV ANTIBODY (ROUTINE TESTING): HIV: NONREACTIVE

## 2015-02-07 LAB — OB RESULTS CONSOLE RUBELLA ANTIBODY, IGM: Rubella: IMMUNE

## 2015-03-01 ENCOUNTER — Inpatient Hospital Stay (HOSPITAL_COMMUNITY)
Admission: AD | Admit: 2015-03-01 | Discharge: 2015-03-01 | Disposition: A | Discharge status: Home / Self Care | Payer: Medicaid Other | Source: Ambulatory Visit | Attending: Obstetrics | Admitting: Obstetrics

## 2015-03-01 ENCOUNTER — Encounter (HOSPITAL_COMMUNITY): Payer: Self-pay | Admitting: *Deleted

## 2015-03-01 DIAGNOSIS — O99612 Diseases of the digestive system complicating pregnancy, second trimester: Secondary | ICD-10-CM | POA: Insufficient documentation

## 2015-03-01 DIAGNOSIS — O210 Mild hyperemesis gravidarum: Secondary | ICD-10-CM | POA: Diagnosis present

## 2015-03-01 DIAGNOSIS — N3001 Acute cystitis with hematuria: Secondary | ICD-10-CM

## 2015-03-01 DIAGNOSIS — O99332 Smoking (tobacco) complicating pregnancy, second trimester: Secondary | ICD-10-CM | POA: Diagnosis not present

## 2015-03-01 DIAGNOSIS — F17210 Nicotine dependence, cigarettes, uncomplicated: Secondary | ICD-10-CM | POA: Diagnosis not present

## 2015-03-01 DIAGNOSIS — O2342 Unspecified infection of urinary tract in pregnancy, second trimester: Secondary | ICD-10-CM | POA: Diagnosis not present

## 2015-03-01 DIAGNOSIS — A084 Viral intestinal infection, unspecified: Secondary | ICD-10-CM | POA: Insufficient documentation

## 2015-03-01 DIAGNOSIS — Z3A19 19 weeks gestation of pregnancy: Secondary | ICD-10-CM | POA: Insufficient documentation

## 2015-03-01 LAB — URINALYSIS, ROUTINE W REFLEX MICROSCOPIC
BILIRUBIN URINE: NEGATIVE
Glucose, UA: NEGATIVE mg/dL
Ketones, ur: NEGATIVE mg/dL
Leukocytes, UA: NEGATIVE
Nitrite: POSITIVE — AB
PH: 8 (ref 5.0–8.0)
Protein, ur: 30 mg/dL — AB
SPECIFIC GRAVITY, URINE: 1.02 (ref 1.005–1.030)

## 2015-03-01 LAB — URINE MICROSCOPIC-ADD ON

## 2015-03-01 MED ORDER — ONDANSETRON HCL 4 MG PO TABS: 4.0000 mg | ORAL_TABLET | Freq: Four times a day (QID) | ORAL | Status: DC

## 2015-03-01 MED ORDER — SODIUM CHLORIDE 0.9 % IV SOLN
INTRAVENOUS | Status: DC
  Administered 2015-03-01: 15:00:00 via INTRAVENOUS

## 2015-03-01 MED ORDER — DEXTROSE 5 % IV SOLN
2.0000 g | Freq: Once | INTRAVENOUS | Status: AC
  Administered 2015-03-01: 2 g via INTRAVENOUS
  Filled 2015-03-01: qty 2

## 2015-03-01 MED ORDER — ONDANSETRON HCL 4 MG/2ML IJ SOLN
4.0000 mg | Freq: Once | INTRAMUSCULAR | Status: AC
  Administered 2015-03-01: 4 mg via INTRAVENOUS
  Filled 2015-03-01: qty 2

## 2015-03-01 NOTE — Discharge Instructions (Signed)
Food Choices to Help Relieve Diarrhea, Adult °When you have diarrhea, the foods you eat and your eating habits are very important. Choosing the right foods and drinks can help relieve diarrhea. Also, because diarrhea can last up to 7 days, you need to replace lost fluids and electrolytes (such as sodium, potassium, and chloride) in order to help prevent dehydration.  °WHAT GENERAL GUIDELINES DO I NEED TO FOLLOW? °· Slowly drink 1 cup (8 oz) of fluid for each episode of diarrhea. If you are getting enough fluid, your urine will be clear or pale yellow. °· Eat starchy foods. Some good choices include white rice, white toast, pasta, low-fiber cereal, baked potatoes (without the skin), saltine crackers, and bagels. °· Avoid large servings of any cooked vegetables. °· Limit fruit to two servings per day. A serving is ½ cup or 1 small piece. °· Choose foods with less than 2 g of fiber per serving. °· Limit fats to less than 8 tsp (38 g) per day. °· Avoid fried foods. °· Eat foods that have probiotics in them. Probiotics can be found in certain dairy products. °· Avoid foods and beverages that may increase the speed at which food moves through the stomach and intestines (gastrointestinal tract). Things to avoid include: °· High-fiber foods, such as dried fruit, raw fruits and vegetables, nuts, seeds, and whole grain foods. °· Spicy foods and high-fat foods. °· Foods and beverages sweetened with high-fructose corn syrup, honey, or sugar alcohols such as xylitol, sorbitol, and mannitol. °WHAT FOODS ARE RECOMMENDED? °Grains °White rice. White, French, or pita breads (fresh or toasted), including plain rolls, buns, or bagels. White pasta. Saltine, soda, or graham crackers. Pretzels. Low-fiber cereal. Cooked cereals made with water (such as cornmeal, farina, or cream cereals). Plain muffins. Matzo. Melba toast. Zwieback.  °Vegetables °Potatoes (without the skin). Strained tomato and vegetable juices. Most well-cooked and canned  vegetables without seeds. Tender lettuce. °Fruits °Cooked or canned applesauce, apricots, cherries, fruit cocktail, grapefruit, peaches, pears, or plums. Fresh bananas, apples without skin, cherries, grapes, cantaloupe, grapefruit, peaches, oranges, or plums.  °Meat and Other Protein Products °Baked or boiled chicken. Eggs. Tofu. Fish. Seafood. Smooth peanut butter. Ground or well-cooked tender beef, ham, veal, lamb, pork, or poultry.  °Dairy °Plain yogurt, kefir, and unsweetened liquid yogurt. Lactose-free milk, buttermilk, or soy milk. Plain hard cheese. °Beverages °Sport drinks. Clear broths. Diluted fruit juices (except prune). Regular, caffeine-free sodas such as ginger ale. Water. Decaffeinated teas. Oral rehydration solutions. Sugar-free beverages not sweetened with sugar alcohols. °Other °Bouillon, broth, or soups made from recommended foods.  °The items listed above may not be a complete list of recommended foods or beverages. Contact your dietitian for more options. °WHAT FOODS ARE NOT RECOMMENDED? °Grains °Whole grain, whole wheat, bran, or rye breads, rolls, pastas, crackers, and cereals. Wild or brown rice. Cereals that contain more than 2 g of fiber per serving. Corn tortillas or taco shells. Cooked or dry oatmeal. Granola. Popcorn. °Vegetables °Raw vegetables. Cabbage, broccoli, Brussels sprouts, artichokes, baked beans, beet greens, corn, kale, legumes, peas, sweet potatoes, and yams. Potato skins. Cooked spinach and cabbage. °Fruits °Dried fruit, including raisins and dates. Raw fruits. Stewed or dried prunes. Fresh apples with skin, apricots, mangoes, pears, raspberries, and strawberries.  °Meat and Other Protein Products °Chunky peanut butter. Nuts and seeds. Beans and lentils. Bacon.  °Dairy °High-fat cheeses. Milk, chocolate milk, and beverages made with milk, such as milk shakes. Cream. Ice cream. °Sweets and Desserts °Sweet rolls, doughnuts, and sweet breads.   Pancakes and waffles. Fats and  Oils Butter. Cream sauces. Margarine. Salad oils. Plain salad dressings. Olives. Avocados.  Beverages Caffeinated beverages (such as coffee, tea, soda, or energy drinks). Alcoholic beverages. Fruit juices with pulp. Prune juice. Soft drinks sweetened with high-fructose corn syrup or sugar alcohols. Other Coconut. Hot sauce. Chili powder. Mayonnaise. Gravy. Cream-based or milk-based soups.  The items listed above may not be a complete list of foods and beverages to avoid. Contact your dietitian for more information. WHAT SHOULD I DO IF I BECOME DEHYDRATED? Diarrhea can sometimes lead to dehydration. Signs of dehydration include dark urine and dry mouth and skin. If you think you are dehydrated, you should rehydrate with an oral rehydration solution. These solutions can be purchased at pharmacies, retail stores, or online.  Drink -1 cup (120-240 mL) of oral rehydration solution each time you have an episode of diarrhea. If drinking this amount makes your diarrhea worse, try drinking smaller amounts more often. For example, drink 1-3 tsp (5-15 mL) every 5-10 minutes.  A general rule for staying hydrated is to drink 1-2 L of fluid per day. Talk to your health care provider about the specific amount you should be drinking each day. Drink enough fluids to keep your urine clear or pale yellow.   This information is not intended to replace advice given to you by your health care provider. Make sure you discuss any questions you have with your health care provider.   Document Released: 04/11/2003 Document Revised: 02/09/2014 Document Reviewed: 12/12/2012 Elsevier Interactive Patient Education 2016 [employer] Pregnancy and Urinary Tract Infection A urinary tract infection (UTI) is a bacterial infection of the urinary tract. Infection of the urinary tract can include the ureters, kidneys (pyelonephritis), bladder (cystitis), and urethra (urethritis). All pregnant women should be screened for bacteria in  the urinary tract. Identifying and treating a UTI will decrease the risk of preterm labor and developing more serious infections in both the mother and baby. CAUSES Bacteria germs cause almost all UTIs.  RISK FACTORS Many factors can increase your chances of getting a UTI during pregnancy. These include:  Having a short urethra.  Poor toilet and hygiene habits.  Sexual intercourse.  Blockage of urine along the urinary tract.  Problems with the pelvic muscles or nerves.  Diabetes.  Obesity.  Bladder problems after having several children.  Previous history of UTI. SIGNS AND SYMPTOMS   Pain, burning, or a stinging feeling when urinating.  Suddenly feeling the need to urinate right away (urgency).  Loss of bladder control (urinary incontinence).  Frequent urination, more than is common with pregnancy.  Lower abdominal or back discomfort.  Cloudy urine.  Blood in the urine (hematuria).  Fever. When the kidneys are infected, the symptoms may be:  Back pain.  Flank pain on the right side more so than the left.  Fever.  Chills.  Nausea.  Vomiting. DIAGNOSIS  A urinary tract infection is usually diagnosed through urine tests. Additional tests and procedures are sometimes done. These may include:  Ultrasound exam of the kidneys, ureters, bladder, and urethra.  Looking in the bladder with a lighted tube (cystoscopy). TREATMENT Typically, UTIs can be treated with antibiotic medicines.  HOME CARE INSTRUCTIONS   Only take over-the-counter or prescription medicines as directed by your health care provider. If you were prescribed antibiotics, take them as directed. Finish them even if you start to feel better.  Drink enough fluids to keep your urine clear or pale yellow.  Do not have sexual  intercourse until the infection is gone and your health care provider says it is okay.  Make sure you are tested for UTIs throughout your pregnancy. These infections often  come back. Preventing a UTI in the Future  Practice good toilet habits. Always wipe from front to back. Use the tissue only once.  Do not hold your urine. Empty your bladder as soon as possible when the urge comes.  Do not douche or use deodorant sprays.  Wash with soap and warm water around the genital area and the anus.  Empty your bladder before and after sexual intercourse.  Wear underwear with a cotton crotch.  Avoid caffeine and carbonated drinks. They can irritate the bladder.  Drink cranberry juice or take cranberry pills. This may decrease the risk of getting a UTI.  Do not drink alcohol.  Keep all your appointments and tests as scheduled. SEEK MEDICAL CARE IF:   Your symptoms get worse.  You are still having fevers 2 or more days after treatment begins.  You have a rash.  You feel that you are having problems with medicines prescribed.  You have abnormal vaginal discharge. SEEK IMMEDIATE MEDICAL CARE IF:   You have back or flank pain.  You have chills.  You have blood in your urine.  You have nausea and vomiting.  You have contractions of your uterus.  You have a gush of fluid from the vagina. MAKE SURE YOU:  Understand these instructions.   Will watch your condition.   Will get help right away if you are not doing well or get worse.    This information is not intended to replace advice given to you by your health care provider. Make sure you discuss any questions you have with your health care provider.   Document Released: 05/16/2010 Document Revised: 11/09/2012 Document Reviewed: 08/18/2012 Elsevier Interactive Patient Education [employer]

## 2015-03-01 NOTE — MAU Provider Note (Signed)
History     CSN: [id number]  Arrival date and time: 03/01/15 1353   First Provider Initiated Contact with Patient 03/01/15 1457      Chief Complaint  Patient presents with  . Hematemesis   HPI [name] is a 25 y.o. [phone] at [redacted]w[redacted]d who presents to MAU today with complaint of N/V/D since 0130 today. The patient states that she has not had N/V previously in this pregnancy. She states 2 episodes of watery stools today and multiple episodes of vomiting. She is also concerned because she has noted occasional streaks of blood in her vomit. She denies abdominal pain, vaginal bleeding or LOF. She has not yet noted FM in this pregnancy. She denies fever, flank pain or UTI symptoms.   OB History    Gravida Para Term Preterm AB TAB SAB Ectopic Multiple Living        Past Medical History  Diagnosis Date  . No pertinent past medical history   . PIH (pregnancy induced hypertension)   . Asthma   . Pregnancy induced hypertension   . Trichomonas   . Miscarriage within last 12 months     Past Surgical History  Procedure Laterality Date  . Dilation and curettage of uterus    . Induced abortion    . Cesarean section N/A 04/13/2014    Procedure: VAGINAL DELIVERY ;  Surgeon: Kathreen Cosier, MD;  Location: [facility];  Service: Obstetrics;  Laterality: N/A;    Family History  Problem Relation Age of Onset  . Hypertension Mother   . Cancer Paternal Uncle   . Asthma Sister     Social History  Substance Use Topics  . Smoking status: Current Every Day Smoker -- 0.25 packs/day    Types: Cigarettes  . Smokeless tobacco: Never Used  . Alcohol Use: No    Allergies:  Allergies  Allergen Reactions  . Shellfish Allergy Itching, Swelling and Rash    Prescriptions prior to admission  Medication Sig Dispense Refill Last Dose  . cephALEXin (KEFLEX) 500 MG capsule Take 1 capsule (500 mg total) by mouth 4 (four) times daily. (Patient not taking: Reported on  03/01/2015) 28 capsule 0 Not Taking at Unknown time  . promethazine (PHENERGAN) 25 MG tablet Take 0.5-1 tablets (12.5-25 mg total) by mouth every 6 (six) hours as needed. (Patient not taking: Reported on 03/01/2015) 30 tablet 2 Not Taking at Unknown time    Review of Systems  Constitutional: Negative for fever and malaise/fatigue.  Gastrointestinal: Positive for nausea and vomiting. Negative for abdominal pain, diarrhea and constipation.  Genitourinary: Negative for dysuria, urgency, frequency and flank pain.       Neg -vaginal bleeding, discharge, LOF   Physical Exam   Blood pressure 99/58, pulse 85, temperature 98.6 F (37 C), temperature source Oral, resp. rate 17, height 5' 2.6" (1.59 m), weight 130 lb (58.968 kg), last menstrual period 10/12/2014, SpO2 100 %, unknown if currently breastfeeding.  Physical Exam  Nursing note and vitals reviewed. Constitutional: She is oriented to person, place, and time. She appears well-developed and well-nourished. No distress.  HENT:  Head: Normocephalic and atraumatic.  Cardiovascular: Normal rate.   Respiratory: Effort normal.  GI: Soft. She exhibits no distension and no mass. There is no tenderness. There is no rebound, no guarding and no CVA tenderness.  Neurological: She is alert and oriented to person, place, and time.  Skin: Skin is warm and dry. No  erythema.  Psychiatric: She has a normal mood and affect.    Results for orders placed or performed during the hospital encounter of 03/01/15 (from the past 24 hour(s))  Urinalysis, Routine w reflex microscopic (not at [facility])     Status: Abnormal   Collection Time: 03/01/15  2:15 PM  Result Value Ref Range   Color, Urine YELLOW YELLOW   APPearance CLEAR CLEAR   Specific Gravity, Urine 1.020 1.005 - 1.030   pH 8.0 5.0 - 8.0   Glucose, UA NEGATIVE NEGATIVE mg/dL   Hgb urine dipstick LARGE (A) NEGATIVE   Bilirubin Urine NEGATIVE NEGATIVE   Ketones, ur NEGATIVE NEGATIVE mg/dL   Protein, ur 30  (A) NEGATIVE mg/dL   Nitrite POSITIVE (A) NEGATIVE   Leukocytes, UA NEGATIVE NEGATIVE  Urine microscopic-add on     Status: Abnormal   Collection Time: 03/01/15  2:15 PM  Result Value Ref Range   Squamous Epithelial / LPF 0-5 (A) NONE SEEN   WBC, UA 0-5 0 - 5 WBC/hpf   RBC / HPF 6-30 0 - 5 RBC/hpf   Bacteria, UA MANY (A) NONE SEEN    MAU Course  Procedures None  MDM UA today shows evidence of UTI Patient is currently living in a shelter. Will treat in MAU with 2 G Rocephin IM Patient given 4 mg Zofran for N/V and is requesting to eat. Patient able to tolerate PO while in MAU and feeling much better  Assessment and Plan  A:  SIUP at [redacted]w[redacted]d UTI in pregnancy, second trimester Viral gastroenteritis  P: Discharge home Rx for Zofran given to patient Warning signs for worsening condition discussed Patient advised to follow-up with Dr. Gaynell Face as scheduled for routine prenatal care or sooner PRN Patient may return to MAU as needed or if her condition were to change or worsen   Marny Lowenstein, PA-C  03/01/2015, 4:35 PM

## 2015-03-01 NOTE — MAU Note (Signed)
Pt states she is currently living in a shelter.  States she began vomiting and diarrhea since 0130 this am.  "Sometimes the vomit has streaks of blood in it with food and fluid".  Denies vaginal bleeding or abnormal discharge.  Pt hasn't felt baby move in this pregnancy yet.

## 2015-03-02 IMAGING — US US FETAL BPP W/O NONSTRESS
1 series · 12 of 12 positions shown · non-contrast
Comparison: none

[Series 1: us fetal bpp w/o nonstress · 0.30mm/px · 12 of 12 slices shown]
[im 1/12]
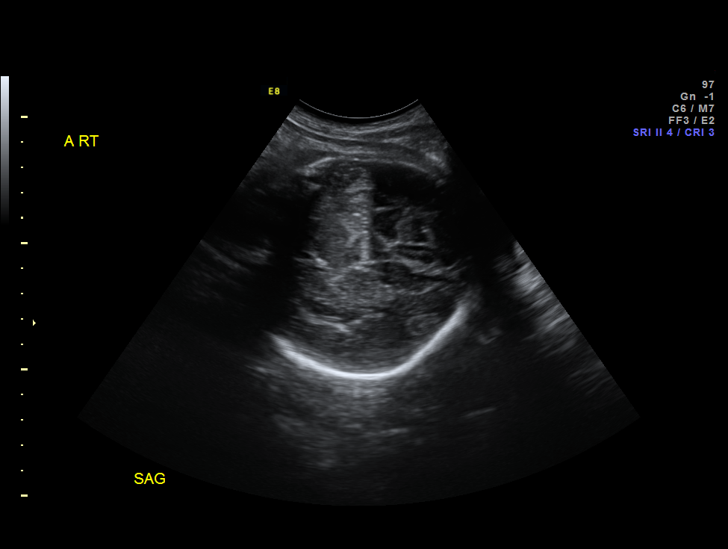
[im 2/12]
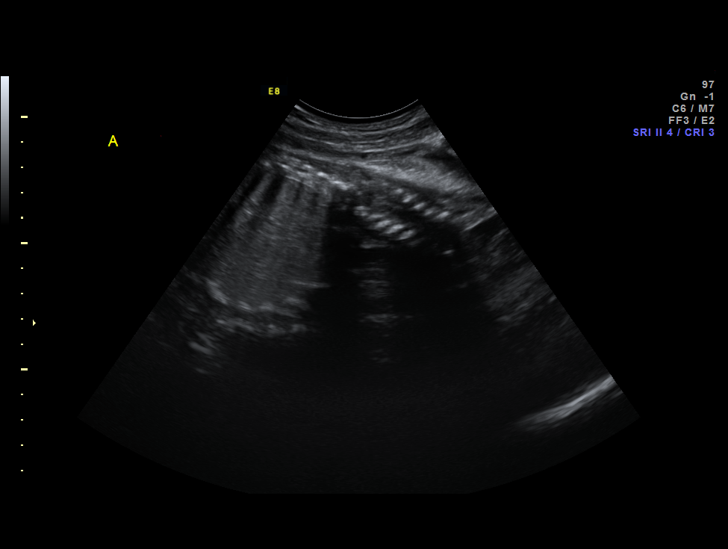
[im 3/12]
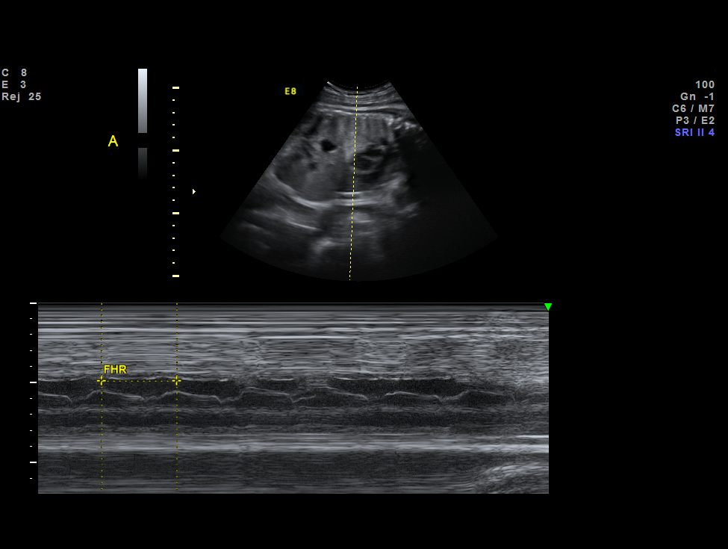
[im 4/12]
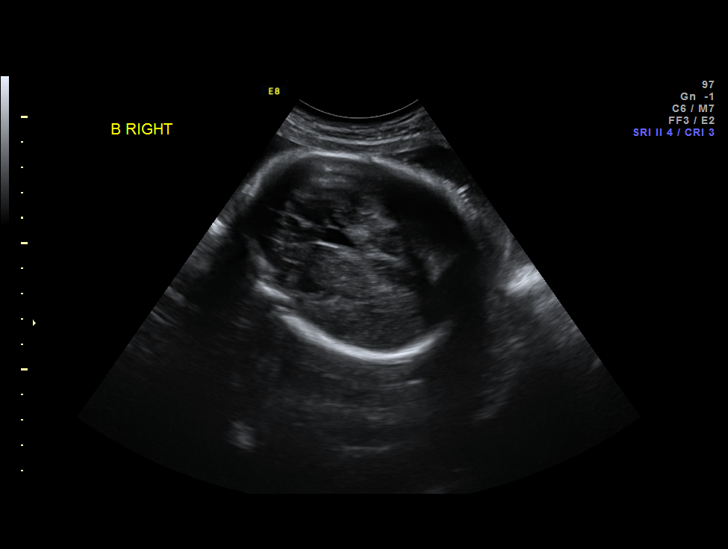
[im 5/12]
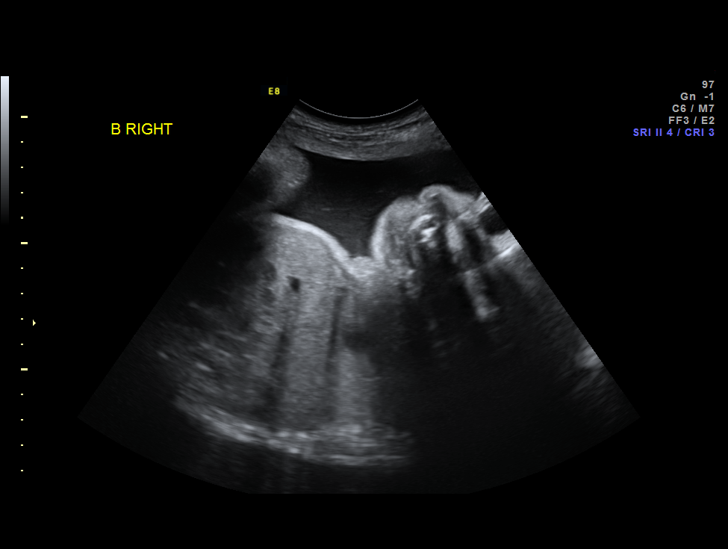
[im 6/12]
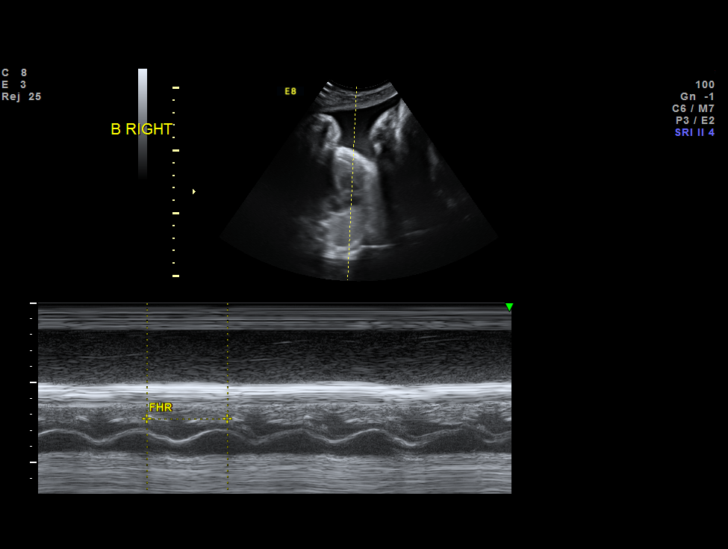
[im 7/12]
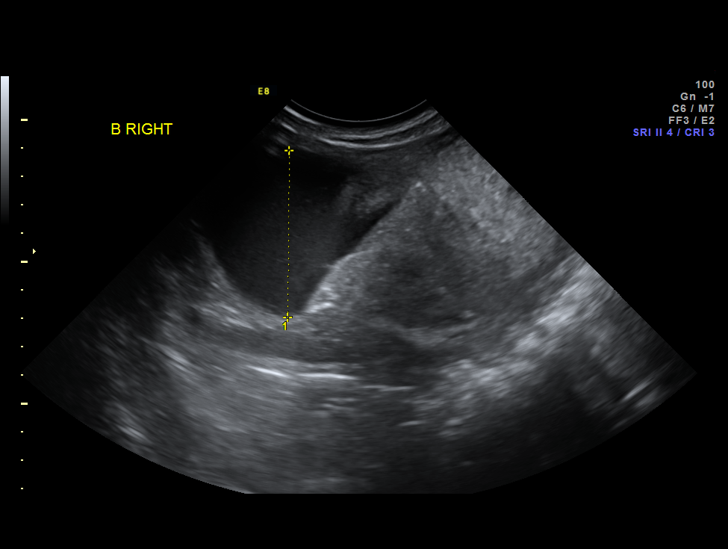
[im 8/12]
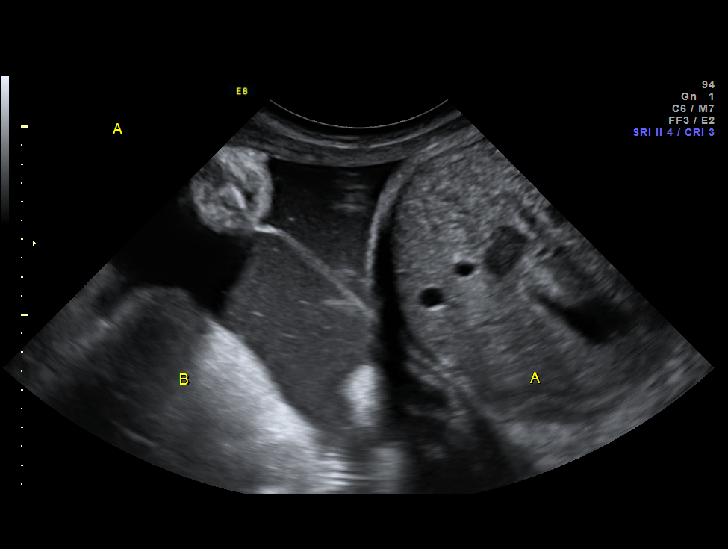
[im 9/12]
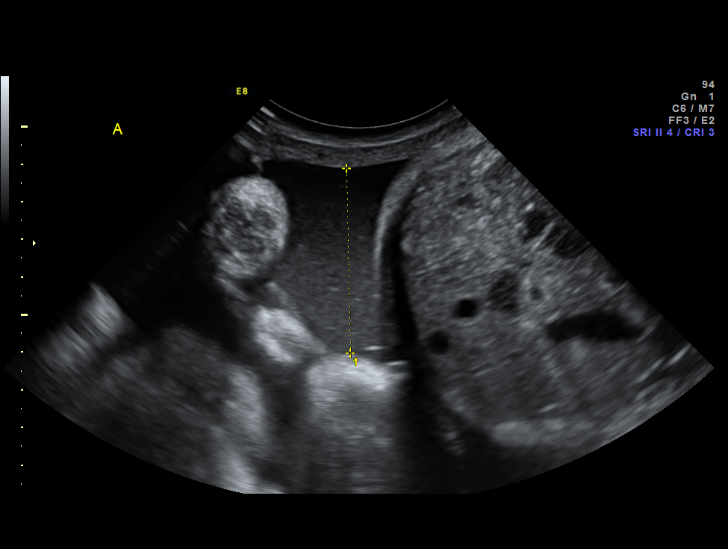
[im 10/12]
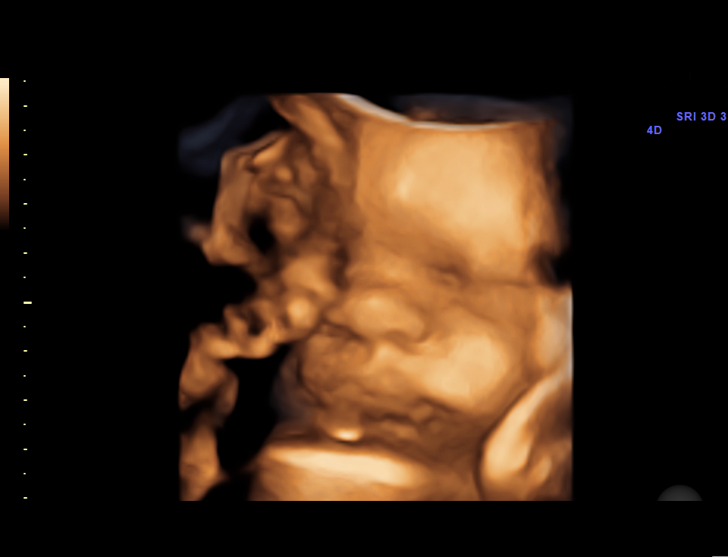
[im 11/12]
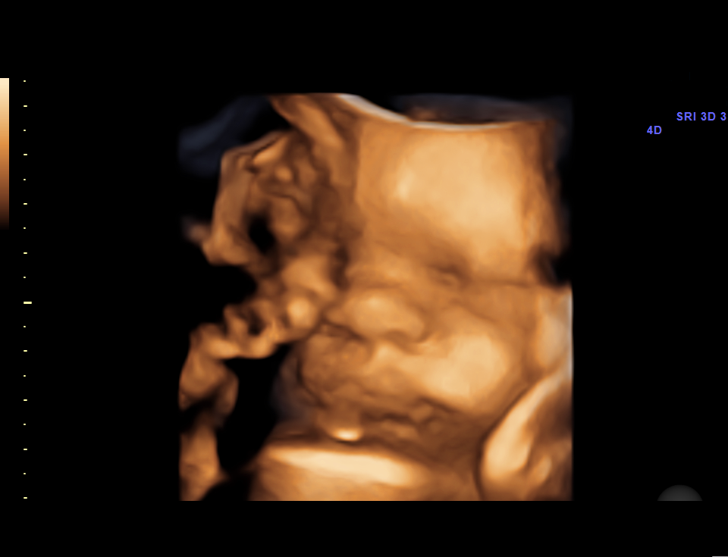
[im 12/12]
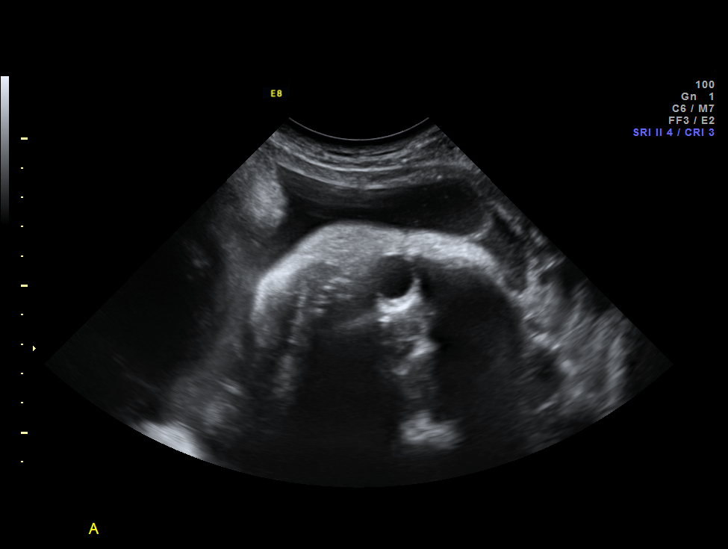

[12 of 12 positions shown; findings below may reference images not displayed]

OBSTETRICS REPORT
                      (Signed Final 03/30/2014 [DATE])

Service(s) Provided

Indications

 35 weeks gestation of pregnancy
 Fetal abnormality - other known or suspected
 (pericardial effusion) - Twin B; resolved
 Poor obstetric history: Previous gestational HTN
 Poor obstetric history: Previous midtrimester loss
 (18 week demise)
 Perinatal infection, Parvovirus
 Twin gestation, Di-Di
Fetal Evaluation (Fetus A)

 Num Of Fetuses:    2
 Fetal Heart Rate:  134                          bpm
 Cardiac Activity:  Observed
 Fetal Lie:         Maternal right side
 Presentation:      Cephalic

 Membrane Desc:     Dividing
                    Membrane seen

 Amniotic Fluid
 AFI FV:      Subjectively within normal limits
                                             Larg Pckt:     5.9  cm
Biophysical Evaluation (Fetus A)

 Amniotic F.V:   Pocket => 2 cm two         F. Tone:        Observed
                 planes
 F. Movement:    Observed                   Score:          [DATE]
 F. Breathing:   Observed
Gestational Age (Fetus A)

 LMP:           35w 1d        Date:  07/27/13                 EDD:   05/03/14
 Best:          35w 1d     Det. By:  LMP  (07/27/13)          EDD:   05/03/14
Fetal Evaluation (Fetus B)
 Num Of Fetuses:    2
 Fetal Heart Rate:  125                          bpm
 Cardiac Activity:  Observed
 Fetal Lie:         Maternal left side
 Presentation:      Cephalic

 Membrane Desc:     Dividing
                    Membrane seen

 Amniotic Fluid
 AFI FV:      Subjectively within normal limits
                                             Larg Pckt:     4.9  cm
Biophysical Evaluation (Fetus B)

 Amniotic F.V:   Pocket => 2 cm two         F. Tone:        Observed
                 planes
 F. Movement:    Observed                   Score:          [DATE]
 F. Breathing:   Observed
Gestational Age (Fetus B)

 LMP:           35w 1d        Date:  07/27/13                 EDD:   05/03/14
 Best:          35w 1d     Det. By:  LMP  (07/27/13)          EDD:   05/03/14
Impression

 Dichorionic/diamniotic twin pregnancy at 35+1 weeks
 Cephalic/cephalic presentation
 Normal amniotic fluid volume x 2
 BPP [DATE] x 2
Recommendations

 Continue weekly BPPs
 Growth next week

## 2015-05-27 ENCOUNTER — Inpatient Hospital Stay (HOSPITAL_COMMUNITY)
Admission: AD | Admit: 2015-05-27 | Discharge: 2015-05-27 | Disposition: A | Discharge status: Home / Self Care | Payer: Medicaid Other | Source: Ambulatory Visit | Attending: Obstetrics | Admitting: Obstetrics

## 2015-05-27 ENCOUNTER — Encounter (HOSPITAL_COMMUNITY): Payer: Self-pay

## 2015-05-27 ENCOUNTER — Inpatient Hospital Stay (HOSPITAL_COMMUNITY): Payer: Medicaid Other

## 2015-05-27 DIAGNOSIS — Z91013 Allergy to seafood: Secondary | ICD-10-CM | POA: Insufficient documentation

## 2015-05-27 DIAGNOSIS — J45909 Unspecified asthma, uncomplicated: Secondary | ICD-10-CM | POA: Diagnosis not present

## 2015-05-27 DIAGNOSIS — Y92488 Other paved roadways as the place of occurrence of the external cause: Secondary | ICD-10-CM | POA: Diagnosis not present

## 2015-05-27 DIAGNOSIS — Y92415 Exit ramp or entrance ramp of street or highway as the place of occurrence of the external cause: Secondary | ICD-10-CM

## 2015-05-27 DIAGNOSIS — R109 Unspecified abdominal pain: Secondary | ICD-10-CM | POA: Insufficient documentation

## 2015-05-27 DIAGNOSIS — Z825 Family history of asthma and other chronic lower respiratory diseases: Secondary | ICD-10-CM | POA: Diagnosis not present

## 2015-05-27 DIAGNOSIS — O26893 Other specified pregnancy related conditions, third trimester: Secondary | ICD-10-CM | POA: Insufficient documentation

## 2015-05-27 DIAGNOSIS — F17210 Nicotine dependence, cigarettes, uncomplicated: Secondary | ICD-10-CM | POA: Insufficient documentation

## 2015-05-27 DIAGNOSIS — O368130 Decreased fetal movements, third trimester, not applicable or unspecified: Secondary | ICD-10-CM | POA: Insufficient documentation

## 2015-05-27 DIAGNOSIS — O368190 Decreased fetal movements, unspecified trimester, not applicable or unspecified: Secondary | ICD-10-CM | POA: Diagnosis not present

## 2015-05-27 DIAGNOSIS — Z3A32 32 weeks gestation of pregnancy: Secondary | ICD-10-CM | POA: Diagnosis not present

## 2015-05-27 DIAGNOSIS — O9A213 Injury, poisoning and certain other consequences of external causes complicating pregnancy, third trimester: Secondary | ICD-10-CM | POA: Diagnosis not present

## 2015-05-27 DIAGNOSIS — S3991XA Unspecified injury of abdomen, initial encounter: Secondary | ICD-10-CM | POA: Diagnosis not present

## 2015-05-27 DIAGNOSIS — O99333 Smoking (tobacco) complicating pregnancy, third trimester: Secondary | ICD-10-CM | POA: Insufficient documentation

## 2015-05-27 LAB — URINALYSIS, ROUTINE W REFLEX MICROSCOPIC
Bilirubin Urine: NEGATIVE
GLUCOSE, UA: NEGATIVE mg/dL
Ketones, ur: NEGATIVE mg/dL
LEUKOCYTES UA: NEGATIVE
NITRITE: NEGATIVE
PROTEIN: NEGATIVE mg/dL
Specific Gravity, Urine: 1.01 (ref 1.005–1.030)
pH: 7 (ref 5.0–8.0)

## 2015-05-27 LAB — URINE MICROSCOPIC-ADD ON

## 2015-05-27 MED ORDER — TERBUTALINE SULFATE 1 MG/ML IJ SOLN
0.2500 mg | Freq: Once | INTRAMUSCULAR | Status: AC
  Administered 2015-05-27: 0.25 mg via SUBCUTANEOUS
  Filled 2015-05-27: qty 1

## 2015-05-27 MED ORDER — ACETAMINOPHEN 325 MG PO TABS
650.0000 mg | ORAL_TABLET | Freq: Once | ORAL | Status: AC
  Administered 2015-05-27: 650 mg via ORAL
  Filled 2015-05-27: qty 2
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
## 2015-06-18 LAB — OB RESULTS CONSOLE GC/CHLAMYDIA
CHLAMYDIA, DNA PROBE: NEGATIVE
Gonorrhea: NEGATIVE

## 2015-06-18 LAB — OB RESULTS CONSOLE GBS: STREP GROUP B AG: NEGATIVE

## 2015-07-16 ENCOUNTER — Encounter (HOSPITAL_COMMUNITY): Payer: Self-pay | Admitting: *Deleted

## 2015-07-16 ENCOUNTER — Other Ambulatory Visit: Payer: Self-pay | Admitting: Obstetrics

## 2015-07-16 ENCOUNTER — Telehealth (HOSPITAL_COMMUNITY): Payer: Self-pay | Admitting: *Deleted

## 2015-07-16 NOTE — Telephone Encounter (Signed)
Preadmission screen  

## 2015-07-19 ENCOUNTER — Encounter (HOSPITAL_COMMUNITY): Payer: Self-pay

## 2015-07-19 ENCOUNTER — Inpatient Hospital Stay (HOSPITAL_COMMUNITY): Payer: Medicaid Other | Admitting: Anesthesiology

## 2015-07-19 ENCOUNTER — Inpatient Hospital Stay (HOSPITAL_COMMUNITY)
Admission: RE | Admit: 2015-07-19 | Discharge: 2015-07-21 | DRG: 775 | Disposition: A | Discharge status: Home / Self Care | Payer: Medicaid Other | Source: Ambulatory Visit | Attending: Obstetrics | Admitting: Obstetrics

## 2015-07-19 DIAGNOSIS — Z3A39 39 weeks gestation of pregnancy: Secondary | ICD-10-CM | POA: Diagnosis not present

## 2015-07-19 LAB — CBC
HCT: 27.2 % — ABNORMAL LOW (ref 36.0–46.0)
HEMOGLOBIN: 8.9 g/dL — AB (ref 12.0–15.0)
MCH: 29.8 pg (ref 26.0–34.0)
MCHC: 32.7 g/dL (ref 30.0–36.0)
MCV: 91 fL (ref 78.0–100.0)
PLATELETS: 166 10*3/uL (ref 150–400)
RBC: 2.99 MIL/uL — AB (ref 3.87–5.11)
RDW: 14.8 % (ref 11.5–15.5)
WBC: 7.3 10*3/uL (ref 4.0–10.5)

## 2015-07-19 LAB — TYPE AND SCREEN
ABO/RH(D): A POS
Antibody Screen: NEGATIVE

## 2015-07-19 LAB — RPR: RPR: NONREACTIVE

## 2015-07-19 MED ORDER — OXYTOCIN 40 UNITS IN LACTATED RINGERS INFUSION - SIMPLE MED: 2.5000 [IU]/h | INTRAVENOUS | Status: DC

## 2015-07-19 MED ORDER — LACTATED RINGERS IV SOLN
INTRAVENOUS | Status: DC
  Administered 2015-07-19: 08:00:00 via INTRAVENOUS

## 2015-07-19 MED ORDER — TERBUTALINE SULFATE 1 MG/ML IJ SOLN
0.2500 mg | Freq: Once | INTRAMUSCULAR | Status: DC | PRN
  Filled 2015-07-19: qty 1

## 2015-07-19 MED ORDER — ACETAMINOPHEN 325 MG PO TABS: 650.0000 mg | ORAL_TABLET | ORAL | Status: DC | PRN

## 2015-07-19 MED ORDER — SIMETHICONE 80 MG PO CHEW: 80.0000 mg | CHEWABLE_TABLET | ORAL | Status: DC | PRN

## 2015-07-19 MED ORDER — WITCH HAZEL-GLYCERIN EX PADS: 1.0000 "application " | MEDICATED_PAD | CUTANEOUS | Status: DC | PRN

## 2015-07-19 MED ORDER — BENZOCAINE-MENTHOL 20-0.5 % EX AERO
1.0000 "application " | INHALATION_SPRAY | CUTANEOUS | Status: DC | PRN
  Administered 2015-07-19: 1 via TOPICAL

## 2015-07-19 MED ORDER — FERROUS SULFATE 325 (65 FE) MG PO TABS
325.0000 mg | ORAL_TABLET | Freq: Two times a day (BID) | ORAL | Status: DC
  Administered 2015-07-20 – 2015-07-21 (×3): 325 mg via ORAL
  Filled 2015-07-19 (×3): qty 1

## 2015-07-19 MED ORDER — DIBUCAINE 1 % RE OINT: 1.0000 "application " | TOPICAL_OINTMENT | RECTAL | Status: DC | PRN

## 2015-07-19 MED ORDER — LACTATED RINGERS IV SOLN
500.0000 mL | Freq: Once | INTRAVENOUS | Status: AC
  Administered 2015-07-19: 500 mL via INTRAVENOUS

## 2015-07-19 MED ORDER — ONDANSETRON HCL 4 MG/2ML IJ SOLN: 4.0000 mg | INTRAMUSCULAR | Status: DC | PRN

## 2015-07-19 MED ORDER — DIPHENHYDRAMINE HCL 25 MG PO CAPS: 25.0000 mg | ORAL_CAPSULE | Freq: Four times a day (QID) | ORAL | Status: DC | PRN

## 2015-07-19 MED ORDER — FLEET ENEMA 7-19 GM/118ML RE ENEM: 1.0000 | ENEMA | RECTAL | Status: DC | PRN

## 2015-07-19 MED ORDER — OXYTOCIN BOLUS FROM INFUSION: 500.0000 mL | INTRAVENOUS | Status: DC

## 2015-07-19 MED ORDER — FENTANYL 2.5 MCG/ML BUPIVACAINE 1/10 % EPIDURAL INFUSION (WH - ANES)
14.0000 mL/h | INTRAMUSCULAR | Status: DC | PRN
  Administered 2015-07-19 (×2): 14 mL/h via EPIDURAL
  Filled 2015-07-19: qty 125

## 2015-07-19 MED ORDER — TETANUS-DIPHTH-ACELL PERTUSSIS 5-2.5-18.5 LF-MCG/0.5 IM SUSP
0.5000 mL | Freq: Once | INTRAMUSCULAR | Status: AC
  Administered 2015-07-21: 0.5 mL via INTRAMUSCULAR
  Filled 2015-07-19: qty 0.5

## 2015-07-19 MED ORDER — ZOLPIDEM TARTRATE 5 MG PO TABS: 5.0000 mg | ORAL_TABLET | Freq: Every evening | ORAL | Status: DC | PRN

## 2015-07-19 MED ORDER — OXYCODONE-ACETAMINOPHEN 5-325 MG PO TABS
1.0000 | ORAL_TABLET | ORAL | Status: DC | PRN
  Administered 2015-07-19: 1 via ORAL
  Administered 2015-07-20 – 2015-07-21 (×2): 2 via ORAL
  Filled 2015-07-19 (×3): qty 1
  Filled 2015-07-19: qty 2

## 2015-07-19 MED ORDER — PHENYLEPHRINE 40 MCG/ML (10ML) SYRINGE FOR IV PUSH (FOR BLOOD PRESSURE SUPPORT)
80.0000 ug | PREFILLED_SYRINGE | INTRAVENOUS | Status: DC | PRN
  Filled 2015-07-19: qty 10
  Filled 2015-07-19: qty 5

## 2015-07-19 MED ORDER — LIDOCAINE HCL (PF) 1 % IJ SOLN
INTRAMUSCULAR | Status: DC | PRN
  Administered 2015-07-19 (×2): 6 mL

## 2015-07-19 MED ORDER — COCONUT OIL OIL: 1.0000 "application " | TOPICAL_OIL | Status: DC | PRN

## 2015-07-19 MED ORDER — DIPHENHYDRAMINE HCL 50 MG/ML IJ SOLN: 12.5000 mg | INTRAMUSCULAR | Status: DC | PRN

## 2015-07-19 MED ORDER — PHENYLEPHRINE 40 MCG/ML (10ML) SYRINGE FOR IV PUSH (FOR BLOOD PRESSURE SUPPORT)
80.0000 ug | PREFILLED_SYRINGE | INTRAVENOUS | Status: DC | PRN
  Filled 2015-07-19: qty 5

## 2015-07-19 MED ORDER — SENNOSIDES-DOCUSATE SODIUM 8.6-50 MG PO TABS
2.0000 | ORAL_TABLET | ORAL | Status: DC
  Administered 2015-07-20 – 2015-07-21 (×2): 2 via ORAL
  Filled 2015-07-19 (×2): qty 2

## 2015-07-19 MED ORDER — ACETAMINOPHEN 325 MG PO TABS
650.0000 mg | ORAL_TABLET | ORAL | Status: DC | PRN
  Administered 2015-07-20: 650 mg via ORAL
  Filled 2015-07-19 (×2): qty 2

## 2015-07-19 MED ORDER — SOD CITRATE-CITRIC ACID 500-334 MG/5ML PO SOLN: 30.0000 mL | ORAL | Status: DC | PRN

## 2015-07-19 MED ORDER — ONDANSETRON HCL 4 MG/2ML IJ SOLN: 4.0000 mg | Freq: Four times a day (QID) | INTRAMUSCULAR | Status: DC | PRN

## 2015-07-19 MED ORDER — LACTATED RINGERS IV SOLN: 500.0000 mL | Freq: Once | INTRAVENOUS | Status: DC

## 2015-07-19 MED ORDER — EPHEDRINE 5 MG/ML INJ
10.0000 mg | INTRAVENOUS | Status: DC | PRN
Start: 2015-07-19 — End: 2015-07-19
  Filled 2015-07-19: qty 2

## 2015-07-19 MED ORDER — EPHEDRINE 5 MG/ML INJ
10.0000 mg | INTRAVENOUS | Status: DC | PRN
  Filled 2015-07-19: qty 2

## 2015-07-19 MED ORDER — OXYTOCIN 40 UNITS IN LACTATED RINGERS INFUSION - SIMPLE MED
1.0000 m[IU]/min | INTRAVENOUS | Status: DC
  Administered 2015-07-19: 2 m[IU]/min via INTRAVENOUS
  Filled 2015-07-19: qty 1000

## 2015-07-19 MED ORDER — PRENATAL MULTIVITAMIN CH
1.0000 | ORAL_TABLET | Freq: Every day | ORAL | Status: DC
  Administered 2015-07-20 – 2015-07-21 (×2): 1 via ORAL
  Filled 2015-07-19 (×2): qty 1

## 2015-07-19 MED ORDER — ONDANSETRON HCL 4 MG PO TABS: 4.0000 mg | ORAL_TABLET | ORAL | Status: DC | PRN

## 2015-07-19 MED ORDER — FENTANYL CITRATE (PF) 100 MCG/2ML IJ SOLN: 50.0000 ug | INTRAMUSCULAR | Status: DC | PRN

## 2015-07-19 MED ORDER — LACTATED RINGERS IV SOLN: 500.0000 mL | INTRAVENOUS | Status: DC | PRN

## 2015-07-19 MED ORDER — IBUPROFEN 600 MG PO TABS
600.0000 mg | ORAL_TABLET | Freq: Four times a day (QID) | ORAL | Status: DC
  Administered 2015-07-19 – 2015-07-21 (×8): 600 mg via ORAL
  Filled 2015-07-19 (×8): qty 1

## 2015-07-19 MED ORDER — LIDOCAINE HCL (PF) 1 % IJ SOLN
30.0000 mL | INTRAMUSCULAR | Status: DC | PRN
  Filled 2015-07-19: qty 30

## 2015-07-19 MED ORDER — BUTORPHANOL TARTRATE 1 MG/ML IJ SOLN: 1.0000 mg | INTRAMUSCULAR | Status: DC | PRN

## 2015-07-19 NOTE — Plan of Care (Signed)
Problem: Tissue Perfusion: Goal: Risk factors for ineffective tissue perfusion will decrease Outcome: Completed/Met Date Met:  07/19/15 TED hose ordered by Dr. Ruthann Cancer pt ambulating and feels she does not need to wear.  Problem: Nutritional: Goal: Mother's verbalization of comfort with breastfeeding process will improve Outcome: Completed/Met Date Met:  07/19/15 Mother is to pump and dump until UDS comes back as she used MJ and cocaine early in pregnancy.

## 2015-07-19 NOTE — Anesthesia Postprocedure Evaluation (Signed)
Anesthesia Post Note  Patient: Julia Cain  Procedure(s) Performed: * No procedures listed *  Patient location during evaluation: Mother Baby Anesthesia Type: Epidural Level of consciousness: awake and alert Pain management: satisfactory to patient Vital Signs Assessment: post-procedure vital signs reviewed and stable Respiratory status: respiratory function stable Cardiovascular status: stable Postop Assessment: no headache, no backache, epidural receding, patient able to bend at knees, no signs of nausea or vomiting and adequate PO intake Anesthetic complications: no Comments: Comfort level was assessed by AnesthesiaTeam and the patient was pleased with the care, interventions, and services provided by the Department of Anesthesia.     Last Vitals:  Filed Vitals:   07/19/15 1731 07/19/15 1844  BP: 120/78 138/84  Pulse: 70   Temp: 37.4 C 37.2 C  Resp: 24 24    Last Pain:  Filed Vitals:   07/19/15 2126  PainSc: 7    Pain Goal: Patients Stated Pain Goal: 0 (07/19/15 2124)               Deakon Frix

## 2015-07-19 NOTE — H&P (Signed)
This is Dr. Francoise CeoBernard Akon Reinoso dictating the history and physical on [facility] she's a 25 year old gravida 6 para 2034 negative GBS EDC 07/22/2015 was brought in for induction cervix is 2 cm 80% vertex -2-3 amniotomy performed the fluids clear and she is on low-dose Pitocin Past medical history negative Past surgical history negative Social history negative System review negative Physical exam well-developed female in early labor HEENT negative Lungs clear to P&A Heart regular rhythm no murmurs no gallops Rest negative Abdomen term Pelvic as described above Extremities negative

## 2015-07-19 NOTE — Anesthesia Preprocedure Evaluation (Signed)
Anesthesia Evaluation  Patient identified by MRN, date of birth, ID band Patient awake    Reviewed: Allergy & Precautions, NPO status , Patient's Chart, lab work & pertinent test results  Airway Mallampati: II  TM Distance: >3 FB Neck ROM: Full    Dental no notable dental hx.    Pulmonary asthma , Current Smoker,    Pulmonary exam normal breath sounds clear to auscultation       Cardiovascular hypertension, Normal cardiovascular exam Rhythm:Regular Rate:Normal     Neuro/Psych negative neurological ROS  negative psych ROS   GI/Hepatic negative GI ROS, Neg liver ROS,   Endo/Other  negative endocrine ROS  Renal/GU negative Renal ROS  negative genitourinary   Musculoskeletal negative musculoskeletal ROS (+)   Abdominal   Peds negative pediatric ROS (+)  Hematology negative hematology ROS (+)   Anesthesia Other Findings   Reproductive/Obstetrics negative OB ROS                             Anesthesia Physical Anesthesia Plan  ASA: II  Anesthesia Plan: Epidural   Post-op Pain Management:    Induction: Intravenous  Airway Management Planned: Natural Airway  Additional Equipment:   Intra-op Plan:   Post-operative Plan:   Informed Consent: I have reviewed the patients History and Physical, chart, labs and discussed the procedure including the risks, benefits and alternatives for the proposed anesthesia with the patient or authorized representative who has indicated his/her understanding and acceptance.   Dental advisory given  Plan Discussed with: CRNA  Anesthesia Plan Comments: (Informed consent obtained prior to proceeding including risk of failure, 1% risk of PDPH, risk of minor discomfort and bruising.  Discussed rare but serious complications including epidural abscess, permanent nerve injury, epidural hematoma.  Discussed alternatives to epidural analgesia and patient desires  to proceed.  Timeout performed pre-procedure verifying patient name, procedure, and platelet count.  Patient tolerated procedure well. )        Anesthesia Quick Evaluation

## 2015-07-19 NOTE — Anesthesia Procedure Notes (Signed)
Epidural Patient location during procedure: OB  Staffing Anesthesiologist: Sherrian DiversENENNY, Faven Watterson  Preanesthetic Checklist Completed: patient identified, site marked, surgical consent, pre-op evaluation, timeout performed, IV checked, risks and benefits discussed and monitors and equipment checked  Epidural Patient position: sitting Prep: ChloraPrep Patient monitoring: heart rate and blood pressure Approach: midline Location: L3-L4 Injection technique: LOR saline  Needle:  Needle type: Tuohy  Needle gauge: 17 G Needle length: 9 cm Needle insertion depth: 5 cm Catheter type: closed end flexible Catheter size: 19 Gauge Catheter at skin depth: 11 cm Test dose: negative and Other  Assessment Events: blood not aspirated, injection not painful, no injection resistance, negative IV test and no paresthesia  Additional Notes Chloraprep used as patient has a shellfish allergy. Prep completely dry at time of needle placement. Tolerated well. No paresthesia.Reason for block:procedure for pain

## 2015-07-19 NOTE — Anesthesia Pain Management Evaluation Note (Signed)
  CRNA Pain Management Visit Note  Patient: Julia Cain, 25 y.o., female  "Hello I am a member of the anesthesia team at [facility]. We have an anesthesia team available at all times to provide care throughout the hospital, including epidural management and anesthesia for C-section. I don't know your plan for the delivery whether it a natural birth, water birth, IV sedation, nitrous supplementation, doula or epidural, but we want to meet your pain goals."   1.Was your pain managed to your expectations on prior hospitalizations?   Yes   2.What is your expectation for pain management during this hospitalization?     Labor support without medications  3.How can we help you reach that goal? Desires natural childbirth  Record the patient's initial score and the patient's pain goal.   Pain: 8  Pain Goal: 10 [facility] wants you to be able to say your pain was always managed very well.  Julia Cain 07/19/2015

## 2015-07-20 LAB — CBC
HEMATOCRIT: 26.8 % — AB (ref 36.0–46.0)
HEMOGLOBIN: 8.6 g/dL — AB (ref 12.0–15.0)
MCH: 29.4 pg (ref 26.0–34.0)
MCHC: 32.1 g/dL (ref 30.0–36.0)
MCV: 91.5 fL (ref 78.0–100.0)
Platelets: 164 10*3/uL (ref 150–400)
RBC: 2.93 MIL/uL — ABNORMAL LOW (ref 3.87–5.11)
RDW: 14.8 % (ref 11.5–15.5)
WBC: 10 10*3/uL (ref 4.0–10.5)

## 2015-07-20 NOTE — Progress Notes (Signed)
Patient ID: Julia BeamBrittany J Riechers, female   DOB: 02-02-1991, 25 y.o.   MRN: [id number] Postpartum day 1 Blood pressure 126/71 afebrile respiration 18 Fundus firm Lochia moderate Legs negative doing well

## 2015-07-20 NOTE — Clinical Social Work Maternal (Signed)
CLINICAL SOCIAL WORK MATERNAL/CHILD NOTE  Patient Details  Name: Julia Cain MRN: [id number] Date of Birth: 1991/02/01  Date:  07/20/2015  Clinical Social Worker Initiating Note:  Peri Maris Date/ Time Initiated:  07/20/15/1220     Child's Name:  Julia Cain   Legal Guardian:  Mother   Need for Interpreter:  None   Date of Referral:  07/19/15     Reason for Referral:  Current Substance Use/Substance Use During Pregnancy  (Positive UDS in 11/2014; no other recent drug screen)   Referral Source:  [employer]   Address:  [place]  Phone number:  [phone]   Household Members:  Self, Minor Children, Other (Comment) (Pt is currently living at the [facility] and can return there at [place])   [employer] (not living in the home):  Immediate Family   Professional Supports: Shelter   Employment: Part-time   Type of Work: MOB reports 2 part-time jobs   Education:   (Unknown)   Museum/gallery curator Resources:  Medicaid   Other Resources:  Physicist, medical , [place] Considerations Which May Impact Care:  None reported  Strengths:  Compliance with medical plan , Pediatrician chosen  (Dr. Truddie Coco)   Risk Factors/Current Problems:   (Pt is currently homeless; living with other minor children at [place])   Cognitive State:  Alert , Linear Thinking    Mood/Affect:  Euthymic  (somewhat guarded)   CSW Assessment: CSW met with MOB with child at bedside. MOB was initially guarded with CSW and asked multiple times why CSW was asking these questions. CSW explained assessment/consult policy and told MOB that because she had a history of THC and cocaine use that this was a standard consult. CSW also reported to mother that child's UDS was negative and that the result from the umbilical cord are still pending. MOB became less guarded with CSW and reported that she is living at the [facility] with her 4 other children; Ebony Hail is currently keeping her children  until she returns tomorrow. Pt reports that she has transportation at discharge and has no other needs at this time. Pt denies any substance use since her last positive screen in 11/2014.   CSW Plan/Description:  No Further Intervention Required/No Barriers to Discharge    Bo Mcclintock, LCSW 07/20/2015, 12:27 PM

## 2015-07-21 NOTE — Progress Notes (Signed)
Mom came out of room and stood and talked with RN.  Mom shared that dad was incarcerated for 'attempting to traffic heroine and cocaine'.  He has been sentenced to 2 years per our conversation.  Mom has seemed to build a trusting relationship with RN

## 2015-07-21 NOTE — Progress Notes (Signed)
RN noticed mom was tired.  Pt began to 'warm up' and talk a little.  She did smile and make conversation with RN. RN offered to do one feeding throughout the night so mom could rest.  RN changed, and fed infant at 2300.

## 2015-07-21 NOTE — Progress Notes (Signed)
Patient ID: Julia Cain, female   DOB: 1990-05-07, 25 y.o.   MRN: [id number] Postpartum day 2 Blood pressure 02/14/1962 pulse 63 respiration 18 Fundus firm Lochia moderate Legs negative doing well home today

## 2015-07-21 NOTE — Discharge Summary (Signed)
Obstetric Discharge Summary Reason for Admission: onset of labor Prenatal Procedures: none Intrapartum Procedures: spontaneous vaginal delivery Postpartum Procedures: none Complications-Operative and Postpartum: none HEMOGLOBIN  Date Value Ref Range Status  07/20/2015 8.6* 12.0 - 15.0 g/dL Final   HCT  Date Value Ref Range Status  07/20/2015 26.8* 36.0 - 46.0 % Final    Physical Exam:  General: alert Lochia: appropriate Uterine Fundus: firm Incision: healing well DVT Evaluation: No evidence of DVT seen on physical exam.  Discharge Diagnoses: Term Pregnancy-delivered  Discharge Information: Date: 07/21/2015 Activity: pelvic rest Diet: routine Medications: Tylenol #3 Condition: improved Instructions: refer to practice specific booklet Discharge to: home Follow-up Information    Follow up with HARPER,CHARLES A, MD.   Specialty:  Obstetrics and Gynecology   Contact information:   [address] [place] [address] [phone]       Newborn Data: Live born female  Birth Weight: 7 lb 8.8 oz (3425 g) APGAR: , 9  Home with mother.  MARSHALL,BERNARD A 07/21/2015, 4:53 AM

## 2015-07-21 NOTE — Progress Notes (Signed)
Assumed care of mom and baby.  Mom affect flat, negative comments and body language.  Baby in moms arms.

## 2015-07-21 NOTE — Discharge Instructions (Signed)
if  bleeding more than a period notify me  °No sex for 3 weeks °See me in 3 weeks  °Resume normal activity in am °

## 2015-07-30 ENCOUNTER — Encounter: Payer: Self-pay | Admitting: Obstetrics

## 2015-07-30 ENCOUNTER — Ambulatory Visit (INDEPENDENT_AMBULATORY_CARE_PROVIDER_SITE_OTHER): Payer: Medicaid Other | Admitting: Obstetrics

## 2015-07-30 DIAGNOSIS — Z3009 Encounter for other general counseling and advice on contraception: Secondary | ICD-10-CM

## 2015-07-30 MED ORDER — MEDROXYPROGESTERONE ACETATE 150 MG/ML IM SUSP: 150.0000 mg | INTRAMUSCULAR | Status: DC

## 2015-07-30 MED ORDER — VITAFOL GUMMIES 3.33-0.333-34.8 MG PO CHEW: 3.0000 | CHEWABLE_TABLET | Freq: Every day | ORAL | Status: AC

## 2015-07-30 NOTE — Progress Notes (Signed)
Subjective:     Julia Cain is a 25 y.o. female who presents for a postpartum visit. She is 2 weeks postpartum following a spontaneous vaginal delivery. I have fully reviewed the prenatal and intrapartum course. The delivery was at 39 gestational weeks. Outcome: spontaneous vaginal delivery. Anesthesia: epidural. Postpartum course has been normal. Baby's course has been normal. Baby is feeding by both breast and bottle - Similac Advance. Bleeding thin lochia. Bowel function is normal. Bladder function is normal. Patient is not sexually active. Contraception method is abstinence. Postpartum depression screening: positive, situational.  Tobacco, alcohol and substance abuse history reviewed.  Adult immunizations reviewed including TDAP, rubella and varicella.  The following portions of the patient's history were reviewed and updated as appropriate: allergies, current medications, past family history, past medical history, past social history, past surgical history and problem list.  Review of Systems A comprehensive review of systems was negative except for: Behavioral/Psych: positive for depression   Objective:    BP 123/91 mmHg  Pulse 71  Wt 137 lb (62.143 kg)   PE:  Deferred   100% of 10 min visit spent on counseling and coordination of care.   Assessment:    2 weeks postpartum.  Doing well. Mild situational depression.  Under control for now and doesn't want meds  Plan:    1. Contraception: Depo-Provera injections 2. Depo Provera Rx 3. Follow up in: 4 weeks or as needed.   Healthy lifestyle practices reviewed

## 2015-08-04 ENCOUNTER — Encounter (HOSPITAL_COMMUNITY): Payer: Self-pay

## 2015-08-08 ENCOUNTER — Other Ambulatory Visit (INDEPENDENT_AMBULATORY_CARE_PROVIDER_SITE_OTHER): Payer: Medicaid Other

## 2015-08-08 VITALS — BP 118/82 | HR 80 | Temp 99.1°F | Resp 18 | Wt 135.5 lb

## 2015-08-08 DIAGNOSIS — Z3042 Encounter for surveillance of injectable contraceptive: Secondary | ICD-10-CM | POA: Diagnosis not present

## 2015-08-08 DIAGNOSIS — Z30013 Encounter for initial prescription of injectable contraceptive: Secondary | ICD-10-CM

## 2015-08-08 MED ORDER — MEDROXYPROGESTERONE ACETATE 150 MG/ML IM SUSP
150.0000 mg | INTRAMUSCULAR | Status: AC
  Administered 2015-08-08 – 2016-01-21 (×3): 150 mg via INTRAMUSCULAR

## 2015-08-08 NOTE — Progress Notes (Signed)
Patient is in the office for her first Depo post partum.

## 2015-08-27 ENCOUNTER — Ambulatory Visit (INDEPENDENT_AMBULATORY_CARE_PROVIDER_SITE_OTHER): Payer: Medicaid Other | Admitting: Obstetrics

## 2015-08-27 ENCOUNTER — Encounter: Payer: Self-pay | Admitting: Obstetrics

## 2015-08-27 DIAGNOSIS — Z3042 Encounter for surveillance of injectable contraceptive: Secondary | ICD-10-CM

## 2015-08-27 DIAGNOSIS — M549 Dorsalgia, unspecified: Secondary | ICD-10-CM

## 2015-08-27 MED ORDER — CYCLOBENZAPRINE HCL 10 MG PO TABS: 10.0000 mg | ORAL_TABLET | Freq: Three times a day (TID) | ORAL | 1 refills | Status: AC | PRN

## 2015-08-27 MED ORDER — IBUPROFEN 800 MG PO TABS: 800.0000 mg | ORAL_TABLET | Freq: Three times a day (TID) | ORAL | 5 refills | Status: DC | PRN

## 2015-08-27 NOTE — Progress Notes (Signed)
Postpartum visit note done.  Coral Ceo MD

## 2015-08-27 NOTE — Patient Instructions (Addendum)
Subjective:     Julia Cain is a 25 y.o. female who presents for a postpartum visit. She is 6 weeks postpartum following a spontaneous vaginal delivery. I have fully reviewed the prenatal and intrapartum course. The delivery was at 40 gestational weeks. Outcome: spontaneous vaginal delivery. Anesthesia: epidural. Postpartum course has been normal. Baby's course has been normal. Baby is feeding by both breast and bottle - Similac Advance. Bleeding no bleeding. Bowel function is normal. Bladder function is normal. Patient is not sexually active. Contraception method is Depo-Provera injections. Postpartum depression screening: negative.  Tobacco, alcohol and substance abuse history reviewed.  Adult immunizations reviewed including TDAP, rubella and varicella.  The following portions of the patient's history were reviewed and updated as appropriate: allergies, current medications, past family history, past medical history, past social history, past surgical history and problem list.  Review of Systems A comprehensive review of systems was negative.  Objective:    BP 125/78   Pulse 84   Temp 98.6 F (37 C)   Resp 16   Wt 134 lb (60.8 kg)   Breastfeeding? Yes Comment: breast and bottle feeding  BMI 24.51 kg/m   General:  alert and no distress   Breasts:  inspection negative, no nipple discharge or bleeding, no masses or nodularity palpable  Lungs: clear to auscultation bilaterally  Heart:  regular rate and rhythm, S1, S2 normal, no murmur, click, rub or gallop  Abdomen: soft, non-tender; bowel sounds normal; no masses,  no organomegaly   Vulva:  normal  Vagina: normal vagina  Cervix:  no cervical motion tenderness  Corpus: normal size, contour, position, consistency, mobility, non-tender  Adnexa:  no mass, fullness, tenderness  Rectal Exam: Not performed.        Assessment:     Normal postpartum exam. Pap smear not done at today's visit.  Plan:    1. Contraception:  Depo-Provera injections 2. Continue PNV's 3. Follow up in: 6 months or as needed.  Healthy lifestyle practices reviewed

## 2015-10-30 ENCOUNTER — Ambulatory Visit (INDEPENDENT_AMBULATORY_CARE_PROVIDER_SITE_OTHER): Payer: Medicaid Other | Admitting: *Deleted

## 2015-10-30 VITALS — BP 108/74 | HR 64

## 2015-10-30 DIAGNOSIS — Z3042 Encounter for surveillance of injectable contraceptive: Secondary | ICD-10-CM

## 2015-10-30 NOTE — Progress Notes (Signed)
Pt is in office for depo injection. Pt is on time for her injection. Pt tolerated well. Pt advised to RTO on 01/21/16 for next depo.  Administrations This Visit    medroxyPROGESTERone (DEPO-PROVERA) injection 150 mg    Admin Date 10/30/2015 Action Given Dose 150 mg Route Intramuscular Administered By Lanney GinsSuzanne D Melia Hopes, CMA

## 2015-12-05 ENCOUNTER — Other Ambulatory Visit: Payer: Self-pay | Admitting: Obstetrics

## 2015-12-05 ENCOUNTER — Telehealth: Payer: Self-pay | Admitting: *Deleted

## 2015-12-05 DIAGNOSIS — M549 Dorsalgia, unspecified: Secondary | ICD-10-CM

## 2015-12-05 MED ORDER — IBUPROFEN 800 MG PO TABS
800.0000 mg | ORAL_TABLET | Freq: Three times a day (TID) | ORAL | 5 refills | Status: DC | PRN
Start: 2015-12-05 — End: 2017-10-14

## 2015-12-05 NOTE — Telephone Encounter (Signed)
Request from pharmacy for refill on pt Ibuprofen 800mg .  Please send refill if approved.

## 2016-01-21 ENCOUNTER — Ambulatory Visit (INDEPENDENT_AMBULATORY_CARE_PROVIDER_SITE_OTHER): Payer: Medicaid Other | Admitting: *Deleted

## 2016-01-21 VITALS — BP 126/87 | HR 66

## 2016-01-21 DIAGNOSIS — Z3042 Encounter for surveillance of injectable contraceptive: Secondary | ICD-10-CM

## 2016-01-21 NOTE — Progress Notes (Signed)
Pt is in office for depo injection.  Pt is on time for injection.  Pt tolerated well. Pt advised to RTO on 04/13/16 for next depo.   Administrations This Visit    medroxyPROGESTERone (DEPO-PROVERA) injection 150 mg    Admin Date 01/21/2016 Action Given Dose 150 mg Route Intramuscular Administered By Lanney GinsSuzanne D Laynee Lockamy, CMA

## 2016-04-14 ENCOUNTER — Ambulatory Visit: Payer: Medicaid Other

## 2016-04-16 ENCOUNTER — Ambulatory Visit: Payer: Medicaid Other

## 2016-06-25 ENCOUNTER — Ambulatory Visit: Payer: Medicaid Other

## 2017-10-14 ENCOUNTER — Inpatient Hospital Stay (HOSPITAL_COMMUNITY)
Admission: AD | Admit: 2017-10-14 | Discharge: 2017-10-14 | Disposition: A | Discharge status: Home / Self Care | Payer: Self-pay | Source: Ambulatory Visit | Attending: Obstetrics & Gynecology | Admitting: Obstetrics & Gynecology

## 2017-10-14 ENCOUNTER — Inpatient Hospital Stay (HOSPITAL_COMMUNITY): Payer: Self-pay

## 2017-10-14 ENCOUNTER — Other Ambulatory Visit: Payer: Self-pay

## 2017-10-14 ENCOUNTER — Encounter (HOSPITAL_COMMUNITY): Payer: Self-pay | Admitting: *Deleted

## 2017-10-14 DIAGNOSIS — O26899 Other specified pregnancy related conditions, unspecified trimester: Secondary | ICD-10-CM

## 2017-10-14 DIAGNOSIS — O26891 Other specified pregnancy related conditions, first trimester: Secondary | ICD-10-CM

## 2017-10-14 DIAGNOSIS — Z3A01 Less than 8 weeks gestation of pregnancy: Secondary | ICD-10-CM | POA: Insufficient documentation

## 2017-10-14 DIAGNOSIS — R109 Unspecified abdominal pain: Secondary | ICD-10-CM

## 2017-10-14 LAB — CBC WITH DIFFERENTIAL/PLATELET
Basophils Absolute: 0 10*3/uL (ref 0.0–0.1)
Basophils Relative: 0 %
Eosinophils Absolute: 0.2 10*3/uL (ref 0.0–0.7)
Eosinophils Relative: 3 %
HCT: 35.2 % — ABNORMAL LOW (ref 36.0–46.0)
Hemoglobin: 11.6 g/dL — ABNORMAL LOW (ref 12.0–15.0)
Lymphocytes Relative: 43 %
Lymphs Abs: 2.4 10*3/uL (ref 0.7–4.0)
MCH: 31.6 pg (ref 26.0–34.0)
MCHC: 33 g/dL (ref 30.0–36.0)
MCV: 95.9 fL (ref 78.0–100.0)
Monocytes Absolute: 0.3 10*3/uL (ref 0.1–1.0)
Monocytes Relative: 5 %
Neutro Abs: 2.7 10*3/uL (ref 1.7–7.7)
Neutrophils Relative %: 49 %
Platelets: 214 10*3/uL (ref 150–400)
RBC: 3.67 MIL/uL — ABNORMAL LOW (ref 3.87–5.11)
RDW: 13.3 % (ref 11.5–15.5)
WBC: 5.6 10*3/uL (ref 4.0–10.5)

## 2017-10-14 LAB — URINALYSIS, ROUTINE W REFLEX MICROSCOPIC
Bilirubin Urine: NEGATIVE
Glucose, UA: NEGATIVE mg/dL
Ketones, ur: NEGATIVE mg/dL
Leukocytes, UA: NEGATIVE
Nitrite: NEGATIVE
Protein, ur: NEGATIVE mg/dL
Specific Gravity, Urine: 1.021 (ref 1.005–1.030)
pH: 6 (ref 5.0–8.0)

## 2017-10-14 LAB — HCG, QUANTITATIVE, PREGNANCY: hCG, Beta Chain, Quant, S: 32857 m[IU]/mL — ABNORMAL HIGH (ref <5)

## 2017-10-14 LAB — POCT PREGNANCY, URINE: Preg Test, Ur: POSITIVE — AB

## 2017-10-14 NOTE — MAU Provider Note (Signed)
History     CSN: [id number]  Arrival date and time: 10/14/17 1225   First Provider Initiated Contact with Patient 10/14/17 1515      Chief Complaint  Patient presents with  . Abdominal Pain  . Possible Pregnancy   HPI [name] is a 27 y.o. [phone] at Unknown who presents to MAU today with complaint of mild RLQ abdominal pain off and on for the last few days. The patient has taken 30 pregnancy test in the last few days and some are positive and some are negative. She denies vaginal bleeding since her abortion in May in [place]. She states she is on OCPs. She has not had any fever. She has not taken pain medication.    OB History    Gravida  7   Para  3   Term  3   Preterm  0   AB  3   Living  5     SAB  2   TAB  1   Ectopic  0   Multiple  2   Live Births  5           Past Medical History:  Diagnosis Date  . Asthma   . Hx of chlamydia infection   . Hx of gonorrhea   . Miscarriage within last 12 months   . PIH (pregnancy induced hypertension)   . Pregnancy induced hypertension   . Trichomonas     Past Surgical History:  Procedure Laterality Date  . CESAREAN SECTION N/A 04/13/2014   Procedure: VAGINAL DELIVERY ;  Surgeon: Kathreen Cosier, MD;  Location: [facility];  Service: Obstetrics;  Laterality: N/A;  . DILATION AND CURETTAGE OF UTERUS    . INDUCED ABORTION      Family History  Problem Relation Age of Onset  . Hypertension Mother   . Cancer Paternal Uncle   . Asthma Sister     Social History   Tobacco Use  . Smoking status: Current Every Day Smoker    Packs/day: 0.50    Types: Cigarettes  . Smokeless tobacco: Never Used  Substance Use Topics  . Alcohol use: No  . Drug use: No    Types: Marijuana    Comment: currently smokes THC occassionaly    Allergies:  Allergies  Allergen Reactions  . Shellfish Allergy Itching, Swelling and Rash    Medications Prior to Admission  Medication Sig Dispense Refill Last Dose  .  cyclobenzaprine (FLEXERIL) 10 MG tablet Take 1 tablet (10 mg total) by mouth every 8 (eight) hours as needed for muscle spasms. 30 tablet 1   . ibuprofen (ADVIL,MOTRIN) 800 MG tablet Take 1 tablet (800 mg total) by mouth every 8 (eight) hours as needed. 30 tablet 5   . Lurasidone HCl (LATUDA PO) Take by mouth.   Taking  . medroxyPROGESTERone (DEPO-PROVERA) 150 MG/ML injection Inject 1 mL (150 mg total) into the muscle every 3 (three) months. 1 mL 3 Taking  . Prenatal Vit-Fe Phos-FA-Omega (VITAFOL GUMMIES) 3.33-0.333-34.8 MG CHEW Chew 3 tablets by mouth daily before breakfast. 90 tablet 11 Taking    Review of Systems  Constitutional: Negative for fever.  Gastrointestinal: Positive for abdominal pain. Negative for constipation, diarrhea, nausea and vomiting.  Genitourinary: Negative for dysuria, frequency, urgency, vaginal bleeding and vaginal discharge.   Physical Exam   Blood pressure 111/63, pulse 81, temperature 98.9 F (37.2 C), temperature source Oral, resp. rate 16, weight 55 kg, SpO2 100 %, currently breastfeeding.  Physical Exam  Nursing note and vitals reviewed. Constitutional: She is oriented to person, place, and time. She appears well-developed and well-nourished. No distress.  HENT:  Head: Normocephalic and atraumatic.  Cardiovascular: Normal rate.  Respiratory: Effort normal.  GI: Soft. She exhibits no distension. There is no tenderness.  Genitourinary:  Genitourinary Comments: Patient declined  Neurological: She is alert and oriented to person, place, and time.  Skin: Skin is warm and dry. No erythema.  Psychiatric: She has a normal mood and affect.    Results for orders placed or performed during the hospital encounter of 10/14/17 (from the past 24 hour(s))  Urinalysis, Routine w reflex microscopic     Status: Abnormal   Collection Time: 10/14/17  1:26 PM  Result Value Ref Range   Color, Urine YELLOW YELLOW   APPearance CLEAR CLEAR   Specific Gravity, Urine 1.021  1.005 - 1.030   pH 6.0 5.0 - 8.0   Glucose, UA NEGATIVE NEGATIVE mg/dL   Hgb urine dipstick LARGE (A) NEGATIVE   Bilirubin Urine NEGATIVE NEGATIVE   Ketones, ur NEGATIVE NEGATIVE mg/dL   Protein, ur NEGATIVE NEGATIVE mg/dL   Nitrite NEGATIVE NEGATIVE   Leukocytes, UA NEGATIVE NEGATIVE   RBC / HPF 21-50 0 - 5 RBC/hpf   WBC, UA 0-5 0 - 5 WBC/hpf   Bacteria, UA RARE (A) NONE SEEN   Squamous Epithelial / LPF 0-5 0 - 5   Mucus PRESENT   CBC with Differential/Platelet     Status: Abnormal   Collection Time: 10/14/17  1:27 PM  Result Value Ref Range   WBC 5.6 4.0 - 10.5 K/uL   RBC 3.67 (L) 3.87 - 5.11 MIL/uL   Hemoglobin 11.6 (L) 12.0 - 15.0 g/dL   HCT 16.135.2 (L) 09.636.0 - 04.546.0 %   MCV 95.9 78.0 - 100.0 fL   MCH 31.6 26.0 - 34.0 pg   MCHC 33.0 30.0 - 36.0 g/dL   RDW 40.913.3 81.111.5 - 91.415.5 %   Platelets 214 150 - 400 K/uL   Neutrophils Relative % 49 %   Neutro Abs 2.7 1.7 - 7.7 K/uL   Lymphocytes Relative 43 %   Lymphs Abs 2.4 0.7 - 4.0 K/uL   Monocytes Relative 5 %   Monocytes Absolute 0.3 0.1 - 1.0 K/uL   Eosinophils Relative 3 %   Eosinophils Absolute 0.2 0.0 - 0.7 K/uL   Basophils Relative 0 %   Basophils Absolute 0.0 0.0 - 0.1 K/uL  hCG, quantitative, pregnancy     Status: Abnormal   Collection Time: 10/14/17  1:27 PM  Result Value Ref Range   hCG, Beta Chain, Quant, S 32,857 (H) <5 mIU/mL  Pregnancy, urine POC     Status: Abnormal   Collection Time: 10/14/17  1:27 PM  Result Value Ref Range   Preg Test, Ur POSITIVE (A) NEGATIVE   [place] Ob Comp Less 14 Wks  Result Date: 10/14/2017 CLINICAL DATA:  Abdominal pain in early pregnancy.  Unsure of LMP. EXAM: OBSTETRIC <14 WK ULTRASOUND TECHNIQUE: Transabdominal ultrasound was performed for evaluation of the gestation as well as the maternal uterus and adnexal regions. COMPARISON:  None. FINDINGS: Intrauterine gestational sac: Single Yolk sac:  Visualized. Embryo:  Visualized. Cardiac Activity: Visualized. Heart Rate: 134 bpm CRL:   8 mm   6 w  4 d                  US EDC: 06/05/2018 Subchorionic hemorrhage:  None visualized. Maternal uterus/adnexae: Both ovaries are normal appearance. No mass  or abnormal free fluid identified. IMPRESSION: Single living IUP measuring 6 weeks 4 days, with [place] EDC of 06/05/2018. No significant maternal uterine or adnexal abnormality identified. Electronically Signed   By: Myles Rosenthal M.D.   On: 10/14/2017 14:38   MAU Course  Procedures None  MDM +UPT UA, CBC, quant hCG, HIV, RPR and [place] today to rule out ectopic pregnancy  Assessment and Plan  A: SIUP at [redacted]w[redacted]d Abdominal pain in pregnancy, first trimester   P:  Discharge home Tylenol PRN for pain  First trimester precautions discussed Patient advised to follow-up with OB provider of choice. Patient likely planning another abortion with this pregnancy.  Patient may return to MAU as needed or if her condition were to change or worsen  Vonzella Nipple, PA-C 10/14/2017, 3:44 PM

## 2017-10-14 NOTE — MAU Note (Addendum)
30 preg tests in the past wk. 17 were neg, the rest were positive.  Been having some slight pain in RLQ about 4-5 days, doesn't happen often- none today. at first thought it was from sleep. No bleeding. No cycle since abortion in May.

## 2017-10-14 NOTE — MAU Note (Signed)
[id number] called by Magnus SinningWenzel- not in lobby

## 2017-10-14 NOTE — Discharge Instructions (Signed)
First Trimester of Pregnancy The first trimester of pregnancy is from week 1 until the end of week 13 (months 1 through 3). During this time, your baby will begin to develop inside you. At 6-8 weeks, the eyes and face are formed, and the heartbeat can be seen on ultrasound. At the end of 12 weeks, all the baby's organs are formed. Prenatal care is all the medical care you receive before the birth of your baby. Make sure you get good prenatal care and follow all of your doctor's instructions. Follow these instructions at home: Medicines  Take over-the-counter and prescription medicines only as told by your doctor. Some medicines are safe and some medicines are not safe during pregnancy.  Take a prenatal vitamin that contains at least 600 micrograms (mcg) of folic acid.  If you have trouble pooping (constipation), take medicine that will make your stool soft (stool softener) if your doctor approves. Eating and drinking  Eat regular, healthy meals.  Your doctor will tell you the amount of weight gain that is right for you.  Avoid raw meat and uncooked cheese.  If you feel sick to your stomach (nauseous) or throw up (vomit): ? Eat 4 or 5 small meals a day instead of 3 large meals. ? Try eating a few soda crackers. ? Drink liquids between meals instead of during meals.  To prevent constipation: ? Eat foods that are high in fiber, like fresh fruits and vegetables, whole grains, and beans. ? Drink enough fluids to keep your pee (urine) clear or pale yellow. Activity  Exercise only as told by your doctor. Stop exercising if you have cramps or pain in your lower belly (abdomen) or low back.  Do not exercise if it is too hot, too humid, or if you are in a place of great height (high altitude).  Try to avoid standing for long periods of time. Move your legs often if you must stand in one place for a long time.  Avoid heavy lifting.  Wear low-heeled shoes. Sit and stand up straight.  You  can have sex unless your doctor tells you not to. Relieving pain and discomfort  Wear a good support bra if your breasts are sore.  Take warm water baths (sitz baths) to soothe pain or discomfort caused by hemorrhoids. Use hemorrhoid cream if your doctor says it is okay.  Rest with your legs raised if you have leg cramps or low back pain.  If you have puffy, bulging veins (varicose veins) in your legs: ? Wear support hose or compression stockings as told by your doctor. ? Raise (elevate) your feet for 15 minutes, 3-4 times a day. ? Limit salt in your food. Prenatal care  Schedule your prenatal visits by the twelfth week of pregnancy.  Write down your questions. Take them to your prenatal visits.  Keep all your prenatal visits as told by your doctor. This is important. Safety  Wear your seat belt at all times when driving.  Make a list of emergency phone numbers. The list should include numbers for family, friends, the hospital, and police and fire departments. General instructions  Ask your doctor for a referral to a local prenatal class. Begin classes no later than at the start of month 6 of your pregnancy.  Ask for help if you need counseling or if you need help with nutrition. Your doctor can give you advice or tell you where to go for help.  Do not use hot tubs, steam rooms, or   saunas.  Do not douche or use tampons or scented sanitary pads.  Do not cross your legs for long periods of time.  Avoid all herbs and alcohol. Avoid drugs that are not approved by your doctor.  Do not use any tobacco products, including cigarettes, chewing tobacco, and electronic cigarettes. If you need help quitting, ask your doctor. You may get counseling or other support to help you quit.  Avoid cat litter boxes and soil used by cats. These carry germs that can cause birth defects in the baby and can cause a loss of your baby (miscarriage) or stillbirth.  Visit your dentist. At home, brush  your teeth with a soft toothbrush. Be gentle when you floss. Contact a doctor if:  You are dizzy.  You have mild cramps or pressure in your lower belly.  You have a nagging pain in your belly area.  You continue to feel sick to your stomach, you throw up, or you have watery poop (diarrhea).  You have a bad smelling fluid coming from your vagina.  You have pain when you pee (urinate).  You have increased puffiness (swelling) in your face, hands, legs, or ankles. Get help right away if:  You have a fever.  You are leaking fluid from your vagina.  You have spotting or bleeding from your vagina.  You have very bad belly cramping or pain.  You gain or lose weight rapidly.  You throw up blood. It may look like coffee grounds.  You are around people who have German measles, fifth disease, or chickenpox.  You have a very bad headache.  You have shortness of breath.  You have any kind of trauma, such as from a fall or a car accident. Summary  The first trimester of pregnancy is from week 1 until the end of week 13 (months 1 through 3).  To take care of yourself and your unborn baby, you will need to eat healthy meals, take medicines only if your doctor tells you to do so, and do activities that are safe for you and your baby.  Keep all follow-up visits as told by your doctor. This is important as your doctor will have to ensure that your baby is healthy and growing well. This information is not intended to replace advice given to you by your health care provider. Make sure you discuss any questions you have with your health care provider. Document Released: 07/08/2007 Document Revised: 01/28/2016 Document Reviewed: 01/28/2016 [employer] Interactive Patient Education  2017 [employer]  

## 2017-10-15 LAB — HIV ANTIBODY (ROUTINE TESTING W REFLEX): HIV SCREEN 4TH GENERATION: NONREACTIVE

## 2017-10-15 LAB — RPR: RPR Ser Ql: NONREACTIVE

## 2018-08-28 ENCOUNTER — Encounter (HOSPITAL_COMMUNITY): Payer: Self-pay
